# Patient Record
Sex: Female | Born: 1985
Health system: Southern US, Community
[De-identification: ages and names within clinical notes are randomized; demographics above are authoritative.]

## PROBLEM LIST (undated history)

## (undated) ENCOUNTER — Inpatient Hospital Stay (HOSPITAL_COMMUNITY): Payer: Self-pay

## (undated) DIAGNOSIS — N393 Stress incontinence (female) (male): Secondary | ICD-10-CM

## (undated) DIAGNOSIS — R011 Cardiac murmur, unspecified: Secondary | ICD-10-CM

## (undated) DIAGNOSIS — Z8709 Personal history of other diseases of the respiratory system: Secondary | ICD-10-CM

## (undated) DIAGNOSIS — Z8679 Personal history of other diseases of the circulatory system: Secondary | ICD-10-CM

## (undated) HISTORY — DX: Cardiac murmur, unspecified: R01.1

## (undated) HISTORY — PX: MOLE REMOVAL: SHX2046

## (undated) HISTORY — PX: WISDOM TOOTH EXTRACTION: SHX21

---

## 2011-04-14 ENCOUNTER — Emergency Department: Payer: Self-pay | Admitting: Emergency Medicine

## 2011-11-28 ENCOUNTER — Encounter: Payer: Self-pay | Admitting: Obstetrics & Gynecology

## 2011-11-28 ENCOUNTER — Ambulatory Visit (INDEPENDENT_AMBULATORY_CARE_PROVIDER_SITE_OTHER): Payer: Private Health Insurance - Indemnity | Admitting: Obstetrics & Gynecology

## 2011-11-28 VITALS — BP 76/55 | HR 84 | Ht 65.0 in | Wt 151.0 lb

## 2011-11-28 DIAGNOSIS — Z319 Encounter for procreative management, unspecified: Secondary | ICD-10-CM

## 2011-11-28 NOTE — Progress Notes (Signed)
  Subjective:    Patient ID: [name], female    DOB: June 15, 1986, 26 y.o.   MRN: [id number]  HPI  Mrs. [name] is a 26 yo MW G0 who has been with her husband for 8 years, married 4 years, and having unprotected IC for about 6 or 7 years. She has q 28 day cycles and has + ovulation prediction kits. Her husband is young and healthy and had a "normal" semen analysis 3/13. He has never fathered any children. They both deny a history of STIs. She does report dysmenorrhea.  Review of Systems They have received all of their gyn care at [facility] to date She takes MVIs daily    Objective:   Physical Exam        Assessment & Plan:  Primary infertility- I have referred her to Dr. [name] for further evaluation.

## 2012-03-18 ENCOUNTER — Inpatient Hospital Stay (HOSPITAL_COMMUNITY): Payer: Managed Care, Other (non HMO)

## 2012-03-18 ENCOUNTER — Inpatient Hospital Stay (HOSPITAL_COMMUNITY)
Admission: AD | Admit: 2012-03-18 | Discharge: 2012-03-18 | Disposition: A | Discharge status: Home / Self Care | Payer: Managed Care, Other (non HMO) | Source: Ambulatory Visit | Attending: Obstetrics & Gynecology | Admitting: Obstetrics & Gynecology

## 2012-03-18 ENCOUNTER — Encounter (HOSPITAL_COMMUNITY): Payer: Self-pay

## 2012-03-18 DIAGNOSIS — R109 Unspecified abdominal pain: Secondary | ICD-10-CM | POA: Insufficient documentation

## 2012-03-18 DIAGNOSIS — O039 Complete or unspecified spontaneous abortion without complication: Secondary | ICD-10-CM | POA: Insufficient documentation

## 2012-03-18 LAB — CBC
HCT: 40.8 % (ref 36.0–46.0)
Platelets: 244 10*3/uL (ref 150–400)
RBC: 4.48 MIL/uL (ref 3.87–5.11)
RDW: 12.5 % (ref 11.5–15.5)
WBC: 7.9 10*3/uL (ref 4.0–10.5)

## 2012-03-18 LAB — ABO/RH: ABO/RH(D): O POS

## 2012-03-18 MED ORDER — PROMETHAZINE HCL 25 MG PO TABS
25.0000 mg | ORAL_TABLET | Freq: Four times a day (QID) | ORAL | Status: DC | PRN
Start: 2012-03-18 — End: 2012-07-16

## 2012-03-18 MED ORDER — PROMETHAZINE HCL 25 MG/ML IJ SOLN
25.0000 mg | Freq: Once | INTRAMUSCULAR | Status: AC
  Administered 2012-03-18: 25 mg via INTRAMUSCULAR
  Filled 2012-03-18: qty 1

## 2012-03-18 MED ORDER — IBUPROFEN 800 MG PO TABS: 800.0000 mg | ORAL_TABLET | Freq: Three times a day (TID) | ORAL | Status: AC

## 2012-03-18 MED ORDER — HYDROMORPHONE HCL PF 1 MG/ML IJ SOLN
2.0000 mg | Freq: Once | INTRAMUSCULAR | Status: AC
  Administered 2012-03-18: 2 mg via INTRAVENOUS
  Filled 2012-03-18: qty 2

## 2012-03-18 MED ORDER — ONDANSETRON 8 MG PO TBDP: 8.0000 mg | ORAL_TABLET | Freq: Once | ORAL | Status: AC

## 2012-03-18 MED ORDER — KETOROLAC TROMETHAMINE 60 MG/2ML IM SOLN
60.0000 mg | Freq: Once | INTRAMUSCULAR | Status: AC
  Administered 2012-03-18: 60 mg via INTRAMUSCULAR
  Filled 2012-03-18: qty 2

## 2012-03-18 MED ORDER — LACTATED RINGERS IV BOLUS (SEPSIS)
1000.0000 mL | Freq: Once | INTRAVENOUS | Status: AC
  Administered 2012-03-18: 1000 mL via INTRAVENOUS

## 2012-03-18 MED ORDER — IBUPROFEN 800 MG PO TABS: 800.0000 mg | ORAL_TABLET | Freq: Three times a day (TID) | ORAL | Status: DC

## 2012-03-18 MED ORDER — ONDANSETRON 8 MG PO TBDP
8.0000 mg | ORAL_TABLET | Freq: Once | ORAL | Status: AC
  Administered 2012-03-18: 8 mg via ORAL
  Filled 2012-03-18: qty 1

## 2012-03-18 NOTE — MAU Note (Signed)
Patient was taken out to vehicle by wheelchair vomited brought back into room 7 in MAU, Pamelia Hoit NP notified order for Phenergan placed, notified patient and family of plan of care.

## 2012-03-18 NOTE — MAU Note (Signed)
Patient states she has been seeing the [facility] in [place] and conceived on Clomid. States she has some spotting for several days and states they called her yesterday that her BHCG had fallen from 134 to 56.  States she started having intense abdominal pain last night and getting worse, slight spotting.

## 2012-03-18 NOTE — MAU Provider Note (Signed)
  History     CSN: [id number]  Arrival date and time: 03/18/12 1053   First Provider Initiated Contact with Patient 03/18/12 1121      Chief Complaint  Patient presents with  . Abdominal Cramping   HPI  Pt is G1P0 and was seeing [facility] [place]; she conceived on Clomid and has been followed with HCG and ultrasound.  Yesterday the [facility] called her and told her that her HCG had dropped from 134 to 54.  She has been having abdominal pain and spotting for several days.  Her pain has worsened with rectal pressure and she started bleeding when she got to MAU.    Past Medical History  Diagnosis Date  . Heart murmur     Past Surgical History  Procedure Date  . Mole removal     on back  . Wisdom tooth extraction     x 2    Family History  Problem Relation Age of Onset  . Alzheimer's disease Maternal Grandmother   . Osteoporosis Maternal Grandmother   . Hyperlipidemia Maternal Grandmother   . Stroke Paternal Grandfather     History  Substance Use Topics  . Smoking status: Never Smoker   . Smokeless tobacco: Not on file  . Alcohol Use: No    Allergies: No Known Allergies  Prescriptions prior to admission  Medication Sig Dispense Refill  . Misc Natural Product [facility] EYE DROPS #1 OP) Apply 1 drop to eye daily as needed. For dryness/itching      . Multiple Vitamin (MULTIVITAMIN WITH MINERALS) TABS Take 1 tablet by mouth daily.        ROS Physical Exam   Blood pressure 115/75, pulse 99, temperature 98.5 F (36.9 C), temperature source Oral, resp. rate 16, height 5' 4.5" (1.638 m), weight 149 lb 6.4 oz (67.767 kg), last menstrual period 02/06/2012, SpO2 100.00%.  Physical Exam  MAU Course  Procedures  Pt was hyper ventilating and having increase pain- feeling numb and tingling in hands- O2 given and LR bolus along with Toradol 60mg  and Dilaudid 2 mg Pt felt better after medication but then felt nauseated - zofran 8 mg ODT given Pt discharged home and was  leaving and threw up Phenergan 25 mg injection   Assessment and Plan Miscarriage- f/u with Dr. Marice Potter in 1 week at [facility] for repeat HCG Motrin 800 mg q 8 hrs for pain Zofran and phenergan prescription    Jerilyn Gillaspie 03/18/2012, 12:32 PM

## 2012-03-18 NOTE — MAU Note (Signed)
Patient back from ultrasound diaphoretic called Pamelia Hoit NP into room, orders received.

## 2012-03-18 NOTE — MAU Provider Note (Signed)
Attestation of Attending Supervision of Advanced Practitioner (CNM/NP): Evaluation and management procedures were performed by the Advanced Practitioner under my supervision and collaboration.  I have reviewed the Advanced Practitioner's note and chart, and I agree with the management and plan.  Neilani Duffee, M.D. 03/18/2012 3:07 PM  

## 2012-03-18 NOTE — MAU Note (Signed)
O2 at 4L via nasal cannala.Marland Kitchen

## 2012-03-18 NOTE — MAU Note (Signed)
Patient states she is having a lot of rectal pressure.

## 2012-03-25 ENCOUNTER — Other Ambulatory Visit (INDEPENDENT_AMBULATORY_CARE_PROVIDER_SITE_OTHER): Payer: Managed Care, Other (non HMO) | Admitting: *Deleted

## 2012-03-25 DIAGNOSIS — O021 Missed abortion: Secondary | ICD-10-CM

## 2012-03-25 NOTE — Progress Notes (Signed)
Patient is here for hcg quant follow up from the hospital.

## 2012-03-26 LAB — HCG, QUANTITATIVE, PREGNANCY: hCG, Beta Chain, Quant, S: <2 m[IU]/mL

## 2012-05-25 ENCOUNTER — Encounter: Payer: Self-pay | Admitting: Obstetrics and Gynecology

## 2012-07-16 ENCOUNTER — Encounter (HOSPITAL_COMMUNITY): Payer: Self-pay

## 2012-07-17 ENCOUNTER — Ambulatory Visit (HOSPITAL_COMMUNITY): Payer: Managed Care, Other (non HMO)

## 2012-07-17 ENCOUNTER — Ambulatory Visit (HOSPITAL_COMMUNITY)
Admission: AD | Admit: 2012-07-17 | Discharge: 2012-07-17 | Disposition: A | Discharge status: Home / Self Care | Payer: Managed Care, Other (non HMO) | Source: Ambulatory Visit | Attending: Obstetrics and Gynecology | Admitting: Obstetrics and Gynecology

## 2012-07-17 ENCOUNTER — Encounter (HOSPITAL_COMMUNITY)
Admission: AD | Disposition: A | Discharge status: Home / Self Care | Payer: Self-pay | Source: Ambulatory Visit | Attending: Obstetrics and Gynecology

## 2012-07-17 ENCOUNTER — Encounter (HOSPITAL_COMMUNITY): Payer: Self-pay | Admitting: Anesthesiology

## 2012-07-17 ENCOUNTER — Ambulatory Visit (HOSPITAL_COMMUNITY): Payer: Managed Care, Other (non HMO) | Admitting: Anesthesiology

## 2012-07-17 DIAGNOSIS — O021 Missed abortion: Secondary | ICD-10-CM | POA: Insufficient documentation

## 2012-07-17 HISTORY — PX: DILATION AND EVACUATION: SHX1459

## 2012-07-17 SURGERY — DILATION AND EVACUATION, UTERUS
Anesthesia: Monitor Anesthesia Care | Site: Uterus | Wound class: Clean Contaminated

## 2012-07-17 MED ORDER — METOCLOPRAMIDE HCL 5 MG/ML IJ SOLN: 10.0000 mg | Freq: Once | INTRAMUSCULAR | Status: DC | PRN

## 2012-07-17 MED ORDER — DEXAMETHASONE SODIUM PHOSPHATE 4 MG/ML IJ SOLN
INTRAMUSCULAR | Status: DC | PRN
  Administered 2012-07-17: 10 mg via INTRAVENOUS

## 2012-07-17 MED ORDER — LACTATED RINGERS IV SOLN
INTRAVENOUS | Status: DC
  Administered 2012-07-17: 50 mL/h via INTRAVENOUS

## 2012-07-17 MED ORDER — GLYCOPYRROLATE 0.2 MG/ML IJ SOLN
INTRAMUSCULAR | Status: AC
  Filled 2012-07-17: qty 1

## 2012-07-17 MED ORDER — BUPIVACAINE HCL (PF) 0.25 % IJ SOLN
INTRAMUSCULAR | Status: AC
  Filled 2012-07-17: qty 30

## 2012-07-17 MED ORDER — LIDOCAINE HCL (CARDIAC) 20 MG/ML IV SOLN
INTRAVENOUS | Status: AC
  Filled 2012-07-17: qty 5

## 2012-07-17 MED ORDER — PROPOFOL 10 MG/ML IV EMUL
INTRAVENOUS | Status: AC
  Filled 2012-07-17: qty 20

## 2012-07-17 MED ORDER — FENTANYL CITRATE 0.05 MG/ML IJ SOLN
INTRAMUSCULAR | Status: DC | PRN
  Administered 2012-07-17: 50 ug via INTRAVENOUS

## 2012-07-17 MED ORDER — ONDANSETRON HCL 4 MG/2ML IJ SOLN
INTRAMUSCULAR | Status: DC | PRN
  Administered 2012-07-17: 4 mg via INTRAVENOUS

## 2012-07-17 MED ORDER — BUPIVACAINE HCL (PF) 0.25 % IJ SOLN
INTRAMUSCULAR | Status: DC | PRN
  Administered 2012-07-17: 10 mL

## 2012-07-17 MED ORDER — DEXAMETHASONE SODIUM PHOSPHATE 10 MG/ML IJ SOLN
INTRAMUSCULAR | Status: AC
  Filled 2012-07-17: qty 1

## 2012-07-17 MED ORDER — FENTANYL CITRATE 0.05 MG/ML IJ SOLN
INTRAMUSCULAR | Status: AC
  Filled 2012-07-17: qty 2

## 2012-07-17 MED ORDER — FENTANYL CITRATE 0.05 MG/ML IJ SOLN: 25.0000 ug | INTRAMUSCULAR | Status: DC | PRN

## 2012-07-17 MED ORDER — MIDAZOLAM HCL 5 MG/5ML IJ SOLN
INTRAMUSCULAR | Status: DC | PRN
  Administered 2012-07-17: 2 mg via INTRAVENOUS

## 2012-07-17 MED ORDER — CEFAZOLIN SODIUM-DEXTROSE 2-3 GM-% IV SOLR
INTRAVENOUS | Status: DC | PRN
  Administered 2012-07-17: 2 g via INTRAVENOUS

## 2012-07-17 MED ORDER — KETOROLAC TROMETHAMINE 30 MG/ML IJ SOLN
INTRAMUSCULAR | Status: AC
  Filled 2012-07-17: qty 1

## 2012-07-17 MED ORDER — MIDAZOLAM HCL 2 MG/2ML IJ SOLN
INTRAMUSCULAR | Status: AC
  Filled 2012-07-17: qty 2

## 2012-07-17 MED ORDER — PROPOFOL 10 MG/ML IV EMUL
INTRAVENOUS | Status: DC | PRN
  Administered 2012-07-17: 200 mg via INTRAVENOUS

## 2012-07-17 MED ORDER — GLYCOPYRROLATE 0.2 MG/ML IJ SOLN
INTRAMUSCULAR | Status: DC | PRN
  Administered 2012-07-17: 0.2 mg via INTRAVENOUS

## 2012-07-17 MED ORDER — MEPERIDINE HCL 25 MG/ML IJ SOLN: 6.2500 mg | INTRAMUSCULAR | Status: DC | PRN

## 2012-07-17 MED ORDER — LIDOCAINE HCL (CARDIAC) 20 MG/ML IV SOLN
INTRAVENOUS | Status: DC | PRN
  Administered 2012-07-17: 50 mg via INTRAVENOUS

## 2012-07-17 MED ORDER — CEFAZOLIN SODIUM-DEXTROSE 2-3 GM-% IV SOLR
INTRAVENOUS | Status: AC
  Filled 2012-07-17: qty 50

## 2012-07-17 MED ORDER — ONDANSETRON HCL 4 MG/2ML IJ SOLN
INTRAMUSCULAR | Status: AC
  Filled 2012-07-17: qty 2

## 2012-07-17 MED ORDER — KETOROLAC TROMETHAMINE 30 MG/ML IJ SOLN
INTRAMUSCULAR | Status: DC | PRN
  Administered 2012-07-17: 30 mg via INTRAVENOUS

## 2012-07-17 SURGICAL SUPPLY — 15 items
CATH ROBINSON RED A/P 16FR (CATHETERS) ×2 IMPLANT
CLOTH BEACON ORANGE TIMEOUT ST (SAFETY) ×2 IMPLANT
GLOVE BIO SURGEON STRL SZ8 (GLOVE) ×2 IMPLANT
GLOVE BIO SURGEON STRL SZ8.5 (GLOVE) ×2 IMPLANT
GOWN STRL REIN XL XLG (GOWN DISPOSABLE) ×4 IMPLANT
KIT BERKELEY 1ST TRIMESTER 3/8 (MISCELLANEOUS) ×2 IMPLANT
NEEDLE SPNL 22GX3.5 QUINCKE BK (NEEDLE) ×2 IMPLANT
NS IRRIG 1000ML POUR BTL (IV SOLUTION) ×2 IMPLANT
PACK VAGINAL MINOR WOMEN LF (CUSTOM PROCEDURE TRAY) ×2 IMPLANT
PAD OB MATERNITY 4.3X12.25 (PERSONAL CARE ITEMS) ×2 IMPLANT
PAD PREP 24X48 CUFFED NSTRL (MISCELLANEOUS) ×2 IMPLANT
SET BERKELEY SUCTION TUBING (SUCTIONS) ×2 IMPLANT
SYR CONTROL 10ML LL (SYRINGE) ×2 IMPLANT
TOWEL OR 17X24 6PK STRL BLUE (TOWEL DISPOSABLE) ×2 IMPLANT
VACURETTE 8 RIGID CVD (CANNULA) ×2 IMPLANT

## 2012-07-17 NOTE — Anesthesia Postprocedure Evaluation (Signed)
Anesthesia Post Note  Patient: [name]  Procedure(s) Performed: Procedure(s) (LRB): DILATATION AND EVACUATION (N/A)  Anesthesia type: General  Patient location: PACU  Post pain: Pain level controlled  Post assessment: Post-op Vital signs reviewed  Last Vitals:  Filed Vitals:   07/17/12 1417  BP:   Pulse: 88  Temp: 36.5 C  Resp: 18    Post vital signs: Reviewed  Level of consciousness: sedated  Complications: No apparent anesthesia complicationsfj

## 2012-07-17 NOTE — Transfer of Care (Signed)
Immediate Anesthesia Transfer of Care Note  Patient: [name]  Procedure(s) Performed: Procedure(s) (LRB) with comments: DILATATION AND EVACUATION (N/A) - chromosome studies  Patient Location: PACU  Anesthesia Type:General  Level of Consciousness: awake, alert  and oriented  Airway & Oxygen Therapy: Patient Spontanous Breathing and Patient connected to nasal cannula oxygen  Post-op Assessment: Report given to PACU RN and Post -op Vital signs reviewed and stable  Post vital signs: Reviewed and stable  Complications: No apparent anesthesia complications

## 2012-07-17 NOTE — Progress Notes (Signed)
I spoke with pt before her procedure.  She was nervous, particularly about the surgery itself.  She reported that she has good support to help her with her grief.  She is aware that there are resources for support in the community as well.    [id number] [place] Pager, [phone] 12:09 PM   07/17/12 1200  Clinical Encounter Type  Visited With Patient  Visit Type Initial;Spiritual support

## 2012-07-17 NOTE — Anesthesia Preprocedure Evaluation (Signed)
Anesthesia Evaluation  Patient identified by MRN, date of birth, ID band Patient awake    Reviewed: Allergy & Precautions, H&P , NPO status , Patient's Chart, lab work & pertinent test results  Airway Mallampati: II TM Distance: >3 FB Neck ROM: full    Dental No notable dental hx. (+) Teeth Intact   Pulmonary neg pulmonary ROS,    Pulmonary exam normal       Cardiovascular + Valvular Problems/Murmurs Rhythm:regular Rate:Normal     Neuro/Psych negative neurological ROS  negative psych ROS   GI/Hepatic negative GI ROS, Neg liver ROS,   Endo/Other  negative endocrine ROS  Renal/GU negative Renal ROS  negative genitourinary   Musculoskeletal   Abdominal Normal abdominal exam  (+)   Peds  Hematology   Anesthesia Other Findings   Reproductive/Obstetrics (+) Pregnancy                           Anesthesia Physical Anesthesia Plan  ASA: II  Anesthesia Plan: MAC and General LMA   Post-op Pain Management:    Induction:   Airway Management Planned:   Additional Equipment:   Intra-op Plan:   Post-operative Plan:   Informed Consent: I have reviewed the patients History and Physical, chart, labs and discussed the procedure including the risks, benefits and alternatives for the proposed anesthesia with the patient or authorized representative who has indicated his/her understanding and acceptance.   Dental Advisory Given  Plan Discussed with: Anesthesiologist, CRNA and Surgeon  Anesthesia Plan Comments:         Anesthesia Quick Evaluation

## 2012-07-17 NOTE — H&P (Signed)
  [name] is a 26 y.o. female with missed ab at 8 wk, previous SAB's, for D&C and chr analysis on POC.  Medical Hx: negative Allergies: none Medications: none  Review of Systems  Constitutional: Negative.   HENT: Negative.   Eyes: Negative.   Respiratory: Negative.   Cardiovascular: Negative.   Gastrointestinal: Negative.   Genitourinary: Negative.   Musculoskeletal: Negative.   Skin: Negative.   Neurological: Negative.   Endo/Heme/Allergies: Negative.   Psychiatric/Behavioral: Negative.    See VS's Physical Exam  Constitutional: She is oriented to person, place, and time. She appears well-developed and well-nourished.  HENT:  Head: Normocephalic and atraumatic.  Nose: Nose normal.  Mouth/Throat: Oropharynx is clear and moist. No oropharyngeal exudate.  Eyes: Conjunctivae normal and EOM are normal. Pupils are equal, round, and reactive to light. No scleral icterus.  Neck: Normal range of motion. Neck supple. No tracheal deviation present. No thyromegaly present.  Cardiovascular: Normal rate.   Respiratory: Effort normal and breath sounds normal.  GI: Soft. Bowel sounds are normal. She exhibits no distension and no mass. There is no tenderness.  Lymphadenopathy:    She has no cervical adenopathy.  Neurological: She is alert and oriented to person, place, and time. She has normal reflexes.  Skin: Skin is warm.  Psychiatric: She has a normal mood and affect. Her behavior is normal. Judgment and thought content normal.   Imp: Missed Ab at 8 wk RPL  Plan: Suction D&C, chromosome analysis Benefits and risks discussed.  [name]

## 2012-07-20 ENCOUNTER — Encounter (HOSPITAL_COMMUNITY): Payer: Self-pay | Admitting: Obstetrics and Gynecology

## 2012-07-22 ENCOUNTER — Encounter: Payer: Managed Care, Other (non HMO) | Admitting: Obstetrics and Gynecology

## 2012-08-07 NOTE — Op Note (Signed)
OPERATIVE NOTE  Preoperative diagnosis: Missed Abortion at 8 wk, RPL  Postoperative diagnosis: The same  Procedure:Suction D&C  Surgeon: [name]  Anesthesia: General  Complications: None  Estimated blood loss: Less than 20 mL  Specimen: Uterine curettings to pathology  Description of procedure: Patient was placed in dorsal supine position. General anesthesia was administered. She was placed in lithotomy position. She was prepped and draped in sterile manner. Cervix was dilated to 84F with Shawnie Pons dilators. Tissue was obtained under TAUS guidance, confirmed to be chorionic and sent for chromosome analysis. Rest of the uterine contents were aspirated with suction curettage. Using a sharp curet endometrium was curetted and the entire specimen was sent to pathology.  Hemostasis was insured. Instrument count was correct. Estimated blood loss was less than 20 mL. The patient tolerated the procedure well and was transferred to recovery in satisfactory condition.

## 2013-02-19 ENCOUNTER — Encounter: Payer: Self-pay | Admitting: Obstetrics & Gynecology

## 2013-02-19 ENCOUNTER — Other Ambulatory Visit: Payer: Self-pay | Admitting: Obstetrics & Gynecology

## 2013-02-19 ENCOUNTER — Ambulatory Visit (INDEPENDENT_AMBULATORY_CARE_PROVIDER_SITE_OTHER): Payer: Managed Care, Other (non HMO) | Admitting: Obstetrics & Gynecology

## 2013-02-19 VITALS — BP 132/74 | Wt 146.0 lb

## 2013-02-19 DIAGNOSIS — Z3491 Encounter for supervision of normal pregnancy, unspecified, first trimester: Secondary | ICD-10-CM

## 2013-02-19 DIAGNOSIS — Z1151 Encounter for screening for human papillomavirus (HPV): Secondary | ICD-10-CM

## 2013-02-19 DIAGNOSIS — Z124 Encounter for screening for malignant neoplasm of cervix: Secondary | ICD-10-CM

## 2013-02-19 DIAGNOSIS — Z3400 Encounter for supervision of normal first pregnancy, unspecified trimester: Secondary | ICD-10-CM | POA: Insufficient documentation

## 2013-02-19 DIAGNOSIS — Z3480 Encounter for supervision of other normal pregnancy, unspecified trimester: Secondary | ICD-10-CM

## 2013-02-19 DIAGNOSIS — Z3682 Encounter for antenatal screening for nuchal translucency: Secondary | ICD-10-CM

## 2013-02-19 NOTE — Progress Notes (Signed)
P = 76 

## 2013-02-19 NOTE — Progress Notes (Deleted)
Subjective:    [name] is a [id number] [redacted]w[redacted]d being seen today for her first obstetrical visit.  Her obstetrical history is significant for recurrent miscarriages. Patient does intend to breast feed. Pregnancy history fully reviewed.  Patient reports backache and nausea.  Filed Vitals:   02/19/13 1059  BP: 132/74  Weight: 146 lb (66.225 kg)    HISTORY: OB History   Grav Para Term Preterm Abortions TAB SAB Ect Mult Living   3 0 0 0 2 0 2 0 0 0      # Outc Date GA Lbr Len/2nd Wgt Sex Del Anes PTL Lv   1 SAB            2 SAB            3 CUR              Past Medical History  Diagnosis Date  . Heart murmur   . Supervision of normal first pregnancy 02/19/2013    [facility] patient  Genetics: Anatomy: Glucola: GBS: Contraception: Feeding:    Past Surgical History  Procedure Laterality Date  . Mole removal      on back  . Wisdom tooth extraction      x 2  . Dilation and evacuation  07/17/2012    Procedure: DILATATION AND EVACUATION;  Surgeon: [name], MD;  Location: [facility];  Service: Gynecology;  Laterality: N/A;  chromosome studies   Family History  Problem Relation Age of Onset  . Alzheimer's disease Maternal Grandmother   . Osteoporosis Maternal Grandmother   . Hyperlipidemia Maternal Grandmother   . Stroke Paternal Grandfather   . Brain cancer Maternal Grandfather      Exam    Uterus:     Pelvic Exam:    Perineum: No Hemorrhoids   Vulva: normal   Vagina:  normal mucosa   pH:    Cervix: anteverted   Adnexa: normal adnexa   Bony Pelvis: android  System: Breast:  normal appearance, no masses or tenderness   Skin: normal coloration and turgor, no rashes    Neurologic: oriented   Extremities: normal strength, tone, and muscle mass   HEENT PERRLA   Mouth/Teeth mucous membranes moist, pharynx normal without lesions   Neck supple   Cardiovascular: regular rate and rhythm   Respiratory:  appears well, vitals normal, no respiratory  distress, acyanotic, normal RR, ear and throat exam is normal, neck free of mass or lymphadenopathy, chest clear, no wheezing, crepitations, rhonchi, normal symmetric air entry   Abdomen: soft, non-tender; bowel sounds normal; no masses,  no organomegaly   Urinary: urethral meatus normal      Assessment:    Pregnancy: [id number] Patient Active Problem List   Diagnosis Date Noted  . Supervision of normal first pregnancy 02/19/2013        Plan:     Initial labs drawn. Prenatal vitamins. Problem list reviewed and updated. Genetic Screening discussed {GENETIC SCREENING TEST:22046}: {requests/ordered/declines:14581}.  Ultrasound discussed; fetal survey: {requests/ordered/declines:14581}.  Follow up in {numbers 0-4:31231} weeks. ***% of *** min visit spent on counseling and coordination of care.  ***   [name] C. 02/19/2013    Subjective:    [name] is a [id number] [redacted]w[redacted]d being seen today for her first obstetrical visit.  Her obstetrical history is significant for {ob risk factors:10154}. Patient {does/does not:19097} intend to breast feed. Pregnancy history fully reviewed.  Patient reports {sx:14538}.  Filed Vitals:   02/19/13 1059  BP: 132/74  Weight:  146 lb (66.225 kg)    HISTORY: OB History   Grav Para Term Preterm Abortions TAB SAB Ect Mult Living   3 0 0 0 2 0 2 0 0 0      # Outc Date GA Lbr Len/2nd Wgt Sex Del Anes PTL Lv   1 SAB            2 SAB            3 CUR              Past Medical History  Diagnosis Date  . Heart murmur   . Supervision of normal first pregnancy 02/19/2013    [facility] patient  Genetics: Anatomy: Glucola: GBS: Contraception: Feeding:    Past Surgical History  Procedure Laterality Date  . Mole removal      on back  . Wisdom tooth extraction      x 2  . Dilation and evacuation  07/17/2012    Procedure: DILATATION AND EVACUATION;  Surgeon: [name], MD;  Location: [facility];  Service: Gynecology;  Laterality:  N/A;  chromosome studies   Family History  Problem Relation Age of Onset  . Alzheimer's disease Maternal Grandmother   . Osteoporosis Maternal Grandmother   . Hyperlipidemia Maternal Grandmother   . Stroke Paternal Grandfather   . Brain cancer Maternal Grandfather      Exam    Uterus:     Pelvic Exam:    Perineum: {Exam; anus and perineum:14598}   Vulva: {vulva exam:14487}   Vagina:  {vaginal exam:14498}   pH: ***   Cervix: {cervix exam:14595}   Adnexa: {adnexa:12223}   Bony Pelvis: {desc; pelvis:14491}  System: Breast:  {Exam; breast:13139::"normal appearance, no masses or tenderness"}   Skin: {pe skin brief ob:314459::"normal coloration and turgor, no rashes"}    Neurologic: {Exam; neuro:16375}   Extremities: {Exam; extremities:15096}   HEENT {exam; heent:31974}   Mouth/Teeth {pe mouth simple ob:314450::"mucous membranes moist, pharynx normal without lesions"}   Neck {Neck (ob):32092}   Cardiovascular: {HEART EXAM HEM/ONC:21750}   Respiratory:  {Exam; respiratory:5782::"appears well, vitals normal, no respiratory distress, acyanotic, normal RR","ear and throat exam is normal","neck free of mass or lymphadenopathy","chest clear, no wheezing, crepitations, rhonchi, normal symmetric air entry"}   Abdomen: {Exam; abdomen:16834}   Urinary: {exam; urinary female:30845}      Assessment:    Pregnancy: [id number] Patient Active Problem List   Diagnosis Date Noted  . Supervision of normal first pregnancy 02/19/2013        Plan:     Initial labs drawn. Prenatal vitamins. Problem list reviewed and updated. Genetic Screening discussed {GENETIC SCREENING TEST:22046}: {requests/ordered/declines:14581}.  Ultrasound discussed; fetal survey: {requests/ordered/declines:14581}.  Follow up in {numbers 0-4:31231} weeks. ***% of *** min visit spent on counseling and coordination of care.  ***   [name] C. 02/19/2013

## 2013-02-20 LAB — OBSTETRIC PANEL
Antibody Screen: NEGATIVE
Eosinophils Relative: 1 % (ref 0–5)
HCT: 40.3 % (ref 36.0–46.0)
Lymphocytes Relative: 10 % — ABNORMAL LOW (ref 12–46)
Lymphs Abs: 0.8 10*3/uL (ref 0.7–4.0)
MCH: 30.6 pg (ref 26.0–34.0)
MCV: 90.8 fL (ref 78.0–100.0)
Monocytes Absolute: 0.5 10*3/uL (ref 0.1–1.0)
RBC: 4.44 MIL/uL (ref 3.87–5.11)
Rh Type: POSITIVE
Rubella: 2.08 Index — ABNORMAL HIGH (ref <0.90)
WBC: 8 10*3/uL (ref 4.0–10.5)

## 2013-02-20 LAB — HIV ANTIBODY (ROUTINE TESTING W REFLEX): HIV: NONREACTIVE

## 2013-03-04 NOTE — Progress Notes (Signed)
   Subjective:    [name] is a [id number] [redacted]w[redacted]d being seen today for her first obstetrical visit.  Her obstetrical history is significant for none. Patient does intend to breast feed. Pregnancy history fully reviewed.  Patient reports no complaints.  Filed Vitals:   02/19/13 1059  BP: 132/74  Weight: 66.225 kg (146 lb)    HISTORY: OB History   Grav Para Term Preterm Abortions TAB SAB Ect Mult Living   3 0 0 0 2 0 2 0 0 0      # Outc Date GA Lbr Len/2nd Wgt Sex Del Anes PTL Lv   1 SAB            2 SAB            3 CUR              Past Medical History  Diagnosis Date  . Heart murmur   . Supervision of normal first pregnancy 02/19/2013    [facility] patient  Genetics: Anatomy: Glucola: GBS: Contraception: Feeding:    Past Surgical History  Procedure Laterality Date  . Mole removal      on back  . Wisdom tooth extraction      x 2  . Dilation and evacuation  07/17/2012    Procedure: DILATATION AND EVACUATION;  Surgeon: [name], MD;  Location: [facility];  Service: Gynecology;  Laterality: N/A;  chromosome studies   Family History  Problem Relation Age of Onset  . Alzheimer's disease Maternal Grandmother   . Osteoporosis Maternal Grandmother   . Hyperlipidemia Maternal Grandmother   . Stroke Paternal Grandfather   . Brain cancer Maternal Grandfather      Exam    Uterus:     Pelvic Exam:    Perineum: No Hemorrhoids   Vulva: normal   Vagina:  normal mucosa   pH:    Cervix: anteverted   Adnexa: normal adnexa   Bony Pelvis: android  System: Breast:  normal appearance, no masses or tenderness   Skin: normal coloration and turgor, no rashes    Neurologic: oriented   Extremities: normal strength, tone, and muscle mass   HEENT PERRLA   Mouth/Teeth mucous membranes moist, pharynx normal without lesions   Neck supple   Cardiovascular: regular rate and rhythm   Respiratory:  appears well, vitals normal, no respiratory distress, acyanotic,  normal RR, ear and throat exam is normal, neck free of mass or lymphadenopathy, chest clear, no wheezing, crepitations, rhonchi, normal symmetric air entry   Abdomen: soft, non-tender; bowel sounds normal; no masses,  no organomegaly   Urinary: urethral meatus normal      Assessment:    Pregnancy: [id number] Patient Active Problem List   Diagnosis Date Noted  . Supervision of normal first pregnancy 02/19/2013        Plan:     Initial labs drawn. Prenatal vitamins. Problem list reviewed and updated. Genetic Screening discussed Quad Screen: undecided.  Ultrasound discussed; fetal survey: requested.  Follow up in 4 weeks.    Cynithia Hakimi C. 03/04/2013

## 2013-03-10 ENCOUNTER — Ambulatory Visit (INDEPENDENT_AMBULATORY_CARE_PROVIDER_SITE_OTHER): Payer: Managed Care, Other (non HMO) | Admitting: Obstetrics & Gynecology

## 2013-03-10 ENCOUNTER — Encounter: Payer: Self-pay | Admitting: Obstetrics & Gynecology

## 2013-03-10 VITALS — BP 118/81 | Wt 147.0 lb

## 2013-03-10 DIAGNOSIS — Z3400 Encounter for supervision of normal first pregnancy, unspecified trimester: Secondary | ICD-10-CM

## 2013-03-10 NOTE — Progress Notes (Signed)
P - 76 - Doppler picked up FHT of 161 but was unable to actually hear it.

## 2013-03-10 NOTE — Progress Notes (Signed)
Routine visit. No problems. She has her NT scan with MFM on 03/19/13.

## 2013-03-19 ENCOUNTER — Ambulatory Visit (HOSPITAL_COMMUNITY)
Admission: RE | Admit: 2013-03-19 | Discharge: 2013-03-19 | Disposition: A | Discharge status: Home / Self Care | Payer: Managed Care, Other (non HMO) | Source: Ambulatory Visit | Attending: Obstetrics & Gynecology | Admitting: Obstetrics & Gynecology

## 2013-03-19 ENCOUNTER — Other Ambulatory Visit: Payer: Self-pay

## 2013-03-19 DIAGNOSIS — O3510X0 Maternal care for (suspected) chromosomal abnormality in fetus, unspecified, not applicable or unspecified: Secondary | ICD-10-CM | POA: Insufficient documentation

## 2013-03-19 DIAGNOSIS — O351XX0 Maternal care for (suspected) chromosomal abnormality in fetus, not applicable or unspecified: Secondary | ICD-10-CM | POA: Insufficient documentation

## 2013-03-19 DIAGNOSIS — Z3689 Encounter for other specified antenatal screening: Secondary | ICD-10-CM | POA: Insufficient documentation

## 2013-03-19 DIAGNOSIS — Z3682 Encounter for antenatal screening for nuchal translucency: Secondary | ICD-10-CM

## 2013-03-19 NOTE — Progress Notes (Signed)
[name]  was seen today for an ultrasound appointment.  See full report in [facility].  Impression: Single IUP at 12 4/7 weeks Normal NT (1.7 mm), Nasal bone visualized First trimester aneuploidy screen performed as noted above.   Recommendations: Please do not draw triple/quad screen, though patient should be offered MSAFP for neural tube defect screening.  Recommend ultrasound for fetal anatomy at [redacted] weeks gestation.  Alpha Gula, MD

## 2013-03-22 ENCOUNTER — Encounter: Payer: Self-pay | Admitting: Obstetrics & Gynecology

## 2013-04-07 ENCOUNTER — Encounter: Payer: Self-pay | Admitting: Obstetrics and Gynecology

## 2013-04-07 ENCOUNTER — Ambulatory Visit (INDEPENDENT_AMBULATORY_CARE_PROVIDER_SITE_OTHER): Payer: Managed Care, Other (non HMO) | Admitting: Obstetrics and Gynecology

## 2013-04-07 VITALS — BP 123/82 | Wt 149.0 lb

## 2013-04-07 DIAGNOSIS — Z3402 Encounter for supervision of normal first pregnancy, second trimester: Secondary | ICD-10-CM

## 2013-04-07 DIAGNOSIS — Z3400 Encounter for supervision of normal first pregnancy, unspecified trimester: Secondary | ICD-10-CM

## 2013-04-07 NOTE — Progress Notes (Signed)
Patient doing well, without complaints. NT results reviewed. Anatomy ultrasound scheduled for 18-19 weeks

## 2013-04-07 NOTE — Progress Notes (Signed)
P= 80 

## 2013-04-21 ENCOUNTER — Ambulatory Visit (HOSPITAL_COMMUNITY)
Admission: RE | Admit: 2013-04-21 | Discharge: 2013-04-21 | Disposition: A | Discharge status: Home / Self Care | Payer: Managed Care, Other (non HMO) | Source: Ambulatory Visit | Attending: Obstetrics and Gynecology | Admitting: Obstetrics and Gynecology

## 2013-04-21 DIAGNOSIS — Z3689 Encounter for other specified antenatal screening: Secondary | ICD-10-CM | POA: Insufficient documentation

## 2013-04-21 DIAGNOSIS — Z3402 Encounter for supervision of normal first pregnancy, second trimester: Secondary | ICD-10-CM

## 2013-04-24 ENCOUNTER — Encounter: Payer: Self-pay | Admitting: Obstetrics and Gynecology

## 2013-04-28 ENCOUNTER — Ambulatory Visit (INDEPENDENT_AMBULATORY_CARE_PROVIDER_SITE_OTHER): Payer: Managed Care, Other (non HMO) | Admitting: Obstetrics & Gynecology

## 2013-04-28 VITALS — BP 128/72 | Wt 149.0 lb

## 2013-04-28 DIAGNOSIS — Z3400 Encounter for supervision of normal first pregnancy, unspecified trimester: Secondary | ICD-10-CM

## 2013-04-28 DIAGNOSIS — Z3402 Encounter for supervision of normal first pregnancy, second trimester: Secondary | ICD-10-CM

## 2013-04-28 NOTE — Patient Instructions (Signed)
Return to clinic for any obstetric concerns or go to MAU for evaluation  

## 2013-04-28 NOTE — Progress Notes (Signed)
Normal anatomy scan at 17 weeks.  Normal first screen, will check MSAFP today.  No other complaints or concerns.  Routine obstetric precautions reviewed.

## 2013-04-28 NOTE — Progress Notes (Signed)
P=79 

## 2013-04-30 ENCOUNTER — Encounter: Payer: Self-pay | Admitting: Obstetrics & Gynecology

## 2013-04-30 LAB — ALPHA FETOPROTEIN, MATERNAL
AFP: 54.3 IU/mL
Curr Gest Age: 18 wks.days
MoM for AFP: 1.35
Open Spina bifida: NEGATIVE
Osb Risk: 1:4260 {titer}

## 2013-05-19 ENCOUNTER — Encounter: Payer: Self-pay | Admitting: Obstetrics and Gynecology

## 2013-05-19 ENCOUNTER — Ambulatory Visit (INDEPENDENT_AMBULATORY_CARE_PROVIDER_SITE_OTHER): Payer: Managed Care, Other (non HMO) | Admitting: Obstetrics and Gynecology

## 2013-05-19 VITALS — BP 119/77 | Wt 153.0 lb

## 2013-05-19 DIAGNOSIS — Z3400 Encounter for supervision of normal first pregnancy, unspecified trimester: Secondary | ICD-10-CM

## 2013-05-19 DIAGNOSIS — Z3402 Encounter for supervision of normal first pregnancy, second trimester: Secondary | ICD-10-CM

## 2013-05-19 NOTE — Progress Notes (Signed)
P=93 

## 2013-05-19 NOTE — Progress Notes (Signed)
Patient doing well without complaints. Planning circumcision. Does not desire contraception postpartum

## 2013-05-21 ENCOUNTER — Encounter (HOSPITAL_COMMUNITY): Payer: Self-pay | Admitting: *Deleted

## 2013-05-21 ENCOUNTER — Inpatient Hospital Stay (HOSPITAL_COMMUNITY)
Admission: AD | Admit: 2013-05-21 | Discharge: 2013-05-21 | Disposition: A | Discharge status: Home / Self Care | Payer: Managed Care, Other (non HMO) | Source: Ambulatory Visit | Attending: Obstetrics & Gynecology | Admitting: Obstetrics & Gynecology

## 2013-05-21 DIAGNOSIS — Z3402 Encounter for supervision of normal first pregnancy, second trimester: Secondary | ICD-10-CM

## 2013-05-21 DIAGNOSIS — O99891 Other specified diseases and conditions complicating pregnancy: Secondary | ICD-10-CM | POA: Insufficient documentation

## 2013-05-21 DIAGNOSIS — R109 Unspecified abdominal pain: Secondary | ICD-10-CM | POA: Insufficient documentation

## 2013-05-21 LAB — URINE MICROSCOPIC-ADD ON

## 2013-05-21 LAB — URINALYSIS, ROUTINE W REFLEX MICROSCOPIC
Bilirubin Urine: NEGATIVE
Hgb urine dipstick: NEGATIVE
Ketones, ur: NEGATIVE mg/dL
Nitrite: NEGATIVE
Protein, ur: NEGATIVE mg/dL
Specific Gravity, Urine: 1.015 (ref 1.005–1.030)
Urobilinogen, UA: 0.2 mg/dL (ref 0.0–1.0)

## 2013-05-21 MED ORDER — ACETAMINOPHEN 500 MG PO TABS: 500.0000 mg | ORAL_TABLET | Freq: Four times a day (QID) | ORAL | Status: DC | PRN

## 2013-05-21 NOTE — MAU Provider Note (Addendum)
History     CSN: [id number]  Arrival date and time: 05/21/13 1803   First Provider Initiated Contact with Patient 05/21/13 1855      Chief Complaint  Patient presents with  . Groin Pain   HPI Comments: [name] is a 27 y.o. G3P0020 at [redacted]w[redacted]d who presents with crampy pain in bilateral inguinal area. Symptoms started last night but were gone when she woke up. Symptoms restarted around 1430. She has no alleviating factors and no aggravating factors. She is currently feeling the pain. She has had no contractions, no vaginal discharge or bleeding. Good fetal movement. No dysuria, no fever, no back pain no flank pain. No hematuria. PNC at [place]. Next visit October 22.   OB History   Grav Para Term Preterm Abortions TAB SAB Ect Mult Living   3 0 0 0 2 0 2 0 0 0       Past Medical History  Diagnosis Date  . Heart murmur   . Supervision of normal first pregnancy 02/19/2013    [facility] patient  Genetics: Anatomy: Glucola: GBS: Contraception: Feeding:     Past Surgical History  Procedure Laterality Date  . Mole removal      on back  . Wisdom tooth extraction      x 2  . Dilation and evacuation  07/17/2012    Procedure: DILATATION AND EVACUATION;  Surgeon: [name], MD;  Location: [facility];  Service: Gynecology;  Laterality: N/A;  chromosome studies    Family History  Problem Relation Age of Onset  . Alzheimer's disease Maternal Grandmother   . Osteoporosis Maternal Grandmother   . Hyperlipidemia Maternal Grandmother   . Stroke Paternal Grandfather   . Brain cancer Maternal Grandfather     History  Substance Use Topics  . Smoking status: Never Smoker   . Smokeless tobacco: Never Used  . Alcohol Use: No    Allergies: No Known Allergies  Prescriptions prior to admission  Medication Sig Dispense Refill  . folic acid (FOLVITE) 400 MCG tablet Take 400 mcg by mouth daily.      . Multiple Vitamin (MULTIVITAMIN WITH MINERALS) TABS Take 3 tablets  by mouth daily. Takes 3 gummies "Alive" daily.  This is the recommended dose.      . beta carotene 78295 UNIT capsule Take 10,000 Units by mouth daily.      . Biotin 5 MG CAPS Take 5 mg by mouth daily.      . Calcium Carbonate Antacid (TUMS ULTRA 1000 PO) Take 1 tablet by mouth as needed. heartburn        Review of Systems  Constitutional: Negative for fever.  Gastrointestinal: Positive for abdominal pain.  Genitourinary: Negative for dysuria.  Neurological: Negative for headaches.   Physical Exam   Blood pressure 121/72, pulse 82, temperature 98.7 F (37.1 C), temperature source Oral, resp. rate 16, height 5\' 4"  (1.626 m), weight 70.126 kg (154 lb 9.6 oz), last menstrual period 12/21/2012, SpO2 100.00%.  Physical Exam  Constitutional: She appears well-developed and well-nourished. No distress.  Cardiovascular: Normal rate, regular rhythm, normal heart sounds and intact distal pulses.   No murmur heard. Respiratory: Effort normal and breath sounds normal. No respiratory distress. She has no wheezes. She has no rales.  GI: Soft. Bowel sounds are normal. There is tenderness in the suprapubic area. There is no rebound and no guarding.  Suprapubic tenderness located slightly to the right of midline.  Musculoskeletal: Normal range of motion. She exhibits no edema  and no tenderness.  Neurological: She is alert.  Skin: Skin is warm and dry. She is not diaphoretic. No erythema.   Doppler: 145bpm  MAU Course  Procedures  MDM - urinalysis shows trace leukocyte esterase.  Assessment and Plan  [name] is a 27 y.o. G3P0020 at [redacted]w[redacted]d who presents with suprapubic abdominal pain  - Counseled on returning if symptoms did not improve or if they became more severe - Tylenol q6hrs for pain - Keep well hydrated with water - Encouraged to keep scheduled prenatal appointment to follow up on urine culture to insure no growth - Discharge home  Jacquelin Hawking 05/21/2013, 6:56 PM    Pain was mild, No hard evidence of UTI, pt declined pelvic exam so she could eat dinner. Low concern for active infection. Pt to f/u with primary OB. Possible abnl Round ligament presentation.   I spoke with and examined patient and agree with resident's note and plan of care.  Tawana Scale, MD OB Fellow 05/21/2013 9:00 PM

## 2013-05-21 NOTE — MAU Note (Signed)
Patient states she has had pain and pressure in the groin area, some yesterday and again today. Denies bleeding, discharge or vomiting.

## 2013-06-03 NOTE — MAU Provider Note (Signed)
Attestation of Attending Supervision of Fellow: Evaluation and management procedures were performed by the Fellow under my supervision and collaboration.  I have reviewed the Fellow's note and chart, and I agree with the management and plan.    

## 2013-06-16 ENCOUNTER — Ambulatory Visit (INDEPENDENT_AMBULATORY_CARE_PROVIDER_SITE_OTHER): Payer: Managed Care, Other (non HMO) | Admitting: Obstetrics & Gynecology

## 2013-06-16 VITALS — BP 135/80 | Wt 160.0 lb

## 2013-06-16 DIAGNOSIS — Z3402 Encounter for supervision of normal first pregnancy, second trimester: Secondary | ICD-10-CM

## 2013-06-16 DIAGNOSIS — Z3400 Encounter for supervision of normal first pregnancy, unspecified trimester: Secondary | ICD-10-CM

## 2013-06-16 NOTE — Patient Instructions (Signed)
Tetanus, Diphtheria (Td) or Tetanus, Diphtheria, Pertussis (Tdap) Vaccine What You Need to Know WHY GET VACCINATED? Tetanus, diphtheria and pertussis can be very serious diseases. TETANUS (Lockjaw) causes painful muscle spasms and stiffness, usually all over the body.  Tetanus can lead to tightening of muscles in the head and neck so the victim cannot open his mouth or swallow, or sometimes even breathe. Tetanus kills about 1 out of 5 people who are infected. DIPHTHERIA can cause a thick membrane to cover the back of the throat.  Diphtheria can lead to breathing problems, paralysis, heart failure, and even death. PERTUSSIS (Whooping Cough) causes severe coughing spells which can lead to difficulty breathing, vomiting, and disturbed sleep.  Pertussis can lead to weight loss, incontinence, rib fractures and passing out from violent coughing. Up to 2 in 100 adolescents and 5 in 100 adults with pertussis are hospitalized or have complications, including pneumonia and death. These 3 diseases are all caused by bacteria. Diphtheria and pertussis are spread from person to person. Tetanus enters the body through cuts, scratches, or wounds. The [place] saw as many as 200,000 cases a year of diphtheria and pertussis before vaccines were available, and hundreds of cases of tetanus. Since then, tetanus and diphtheria cases have dropped by about 99% and pertussis cases by about 92%. Children 6 years of age and younger get DTaP vaccine to protect them from these three diseases. But older children, adolescents, and adults need protection too. VACCINES FOR ADOLESCENTS AND ADULTS: TD AND TDAP Two vaccines are available to protect people 7 years of age and older from these diseases:  Td vaccine has been used for many years. It protects against tetanus and diphtheria.  Tdap vaccine was licensed in 2005. It is the first vaccine for adolescents and adults that protects against pertussis as well as tetanus and  diphtheria. A Td booster dose is recommended every 10 years. Tdap is given only once. WHICH VACCINE, AND WHEN? Ages 7 through 18 years  A dose of Tdap is recommended at age 11 or 12. This dose could be given as early as age 7 for children who missed one or more childhood doses of DTaP.  Children and adolescents who did not get a complete series of DTaP shots by age 7 should complete the series using a combination of Td and Tdap. Age 19 years and Older  All adults should get a booster dose of Td every 10 years. Adults under 65 who have never gotten Tdap should get a dose of Tdap as their next booster dose. Adults 65 and older may get one booster dose of Tdap.  Adults (including women who may become pregnant and adults 65 and older) who expect to have close contact with a baby younger than 12 months of age should get a dose of Tdap to help protect the baby from pertussis.  Healthcare professionals who have direct patient contact in hospitals or clinics should get one dose of Tdap. Protection After a Wound  A person who gets a severe cut or burn might need a dose of Td or Tdap to prevent tetanus infection. Tdap should be used for anyone who has never had a dose previously. Td should be used if Tdap is not available, or for:  Anybody who has already had a dose of Tdap.  Children 7 through 9 years of age who completed the childhood DTaP series.  Adults 65 and older. Pregnant Women  Pregnant women who have never had a dose of Tdap   should get one, after the 20th week of gestation and preferably during the 3rd trimester. If they do not get Tdap during their pregnancy they should get a dose as soon as possible after delivery. Pregnant women who have previously received Tdap and need tetanus or diphtheria vaccine while pregnant should get Td. Tdap and Td may be given at the same time as other vaccines. SOME PEOPLE SHOULD NOT BE VACCINATED OR SHOULD WAIT  Anyone who has had a life-threatening  allergic reaction after a dose of any tetanus, diphtheria, or pertussis containing vaccine should not get Td or Tdap.  Anyone who has a severe allergy to any component of a vaccine should not get that vaccine. Tell your doctor if the person getting the vaccine has any severe allergies.  Anyone who had a coma, or long or multiple seizures within 7 days after a dose of DTP or DTaP should not get Tdap, unless a cause other than the vaccine was found. These people may get Td.  Talk to your doctor if the person getting either vaccine:  Has epilepsy or another nervous system problem.  Had severe swelling or severe pain after a previous dose of DTP, DTaP, DT, Td, or Tdap vaccine.  Has had Guillain Barr Syndrome (GBS). Anyone who has a moderate or severe illness on the day the shot is scheduled should usually wait until they recover before getting Tdap or Td vaccine. A person with a mild illness or low fever can usually be vaccinated. WHAT ARE THE RISKS FROM TDAP AND TD VACCINES? With a vaccine, as with any medicine, there is always a small risk of a life-threatening allergic reaction or other serious problem. Brief fainting spells and related symptoms (such as jerking movements) can happen after any medical procedure, including vaccination. Sitting or lying down for about 15 minutes after a vaccination can help prevent fainting and injuries caused by falls. Tell your doctor if the patient feels dizzy or lightheaded, or has vision changes or ringing in the ears. Getting tetanus, diphtheria, or pertussis would be much more likely to lead to severe problems than getting either Td or Tdap vaccine. Problems reported after Td and Tdap vaccines are listed below. Mild Problems (noticeable, but did not interfere with activities) Tdap  Pain (about 3 in 4 adolescents and 2 in 3 adults).  Redness or swelling (about 1 in 5).  Mild fever of at least 100.4 F (38 C) (up to about 1 in 25 adolescents and 1 in  100 adults).  Headache (about 4 in 10 adolescents and 3 in 10 adults).  Tiredness (about 1 in 3 adolescents and 1 in 4 adults).  Nausea, vomiting, diarrhea, or stomach ache (up to 1 in 4 adolescents and 1 in 10 adults).  Chills, body aches, sore joints, rash, or swollen glands (uncommon). Td  Pain (up to about 8 in 10).  Redness or swelling at the injection site (up to about 1 in 3).  Mild fever (up to about 1 in 15).  Headache or tiredness (uncommon). Moderate Problems (interfered with activities, but did not require medical attention) Tdap  Pain at the injection site (about 1 in 20 adolescents and 1 in 100 adults).  Redness or swelling at the injection site (up to about 1 in 16 adolescents and 1 in 25 adults).  Fever over 102 F (38.9 C) (about 1 in 100 adolescents and 1 in 250 adults).  Headache (1 in 300).  Nausea, vomiting, diarrhea, or stomach ache (up to 3   in 100 adolescents and 1 in 100 adults). Td  Fever over 102 F (38.9 C) (rare). Tdap or Td  Extensive swelling of the arm where the shot was given (up to about 3 in 100). Severe Problems (Unable to perform usual activities; required medical attention) Tdap or Td  Swelling, severe pain, bleeding, and redness in the arm where the shot was given (rare). A severe allergic reaction could occur after any vaccine. They are estimated to occur less than once in a million doses. WHAT IF THERE IS A SEVERE REACTION? What should I look for? Any unusual condition, such as a severe allergic reaction or a high fever. If a severe allergic reaction occurred, it would be within a few minutes to an hour after the shot. Signs of a serious allergic reaction can include difficulty breathing, weakness, hoarseness or wheezing, a fast heartbeat, hives, dizziness, paleness, or swelling of the throat. What should I do?  Call a doctor, or get the person to a doctor right away.  Tell your doctor what happened, the date and time it  happened, and when the vaccination was given.  Ask your provider to report the reaction by filing a Vaccine Adverse Event Reporting System (VAERS) form. Or, you can file this report through the VAERS website at www.vaers.hhs.gov or by calling 1-800-822-7967. VAERS does not provide medical advice. THE NATIONAL VACCINE INJURY COMPENSATION PROGRAM The National Vaccine Injury Compensation Program (VICP) was created in 1986. Persons who believe they may have been injured by a vaccine can learn about the program and about filing a claim by calling 1-800-338-2382 or visiting the VICP website at www.hrsa.gov/vaccinecompensation. HOW CAN I LEARN MORE?  Your doctor can give you the vaccine package insert or suggest other sources of information.  Call your local or state health department.  Contact the Centers for Disease Control and Prevention (CDC):  Call 1-800-232-4636 (1-800-CDC-INFO).  Visit the CDC website at www.cdc.gov/vaccines. CDC Td and Tdap Interim VIS (09/18/10) Document Released: 06/09/2006 Document Revised: 11/04/2011 Document Reviewed: 09/18/2010 [employer] Patient Information 2014 [employer].  

## 2013-06-16 NOTE — Progress Notes (Signed)
Declines flu vaccine.  No complaints or concerns.  Routine obstetric precautions reviewed.  Third trimester labs and Tdap vaccine next visit.

## 2013-06-16 NOTE — Progress Notes (Signed)
P-85 

## 2013-07-07 ENCOUNTER — Encounter: Payer: Self-pay | Admitting: Obstetrics and Gynecology

## 2013-07-07 ENCOUNTER — Ambulatory Visit (INDEPENDENT_AMBULATORY_CARE_PROVIDER_SITE_OTHER): Payer: Managed Care, Other (non HMO) | Admitting: Obstetrics and Gynecology

## 2013-07-07 VITALS — BP 128/79 | Wt 162.0 lb

## 2013-07-07 DIAGNOSIS — Z3403 Encounter for supervision of normal first pregnancy, third trimester: Secondary | ICD-10-CM

## 2013-07-07 DIAGNOSIS — Z23 Encounter for immunization: Secondary | ICD-10-CM

## 2013-07-07 DIAGNOSIS — Z3400 Encounter for supervision of normal first pregnancy, unspecified trimester: Secondary | ICD-10-CM

## 2013-07-07 LAB — CBC
Hemoglobin: 12.2 g/dL (ref 12.0–15.0)
MCV: 95.7 fL (ref 78.0–100.0)
Platelets: 214 10*3/uL (ref 150–400)
RBC: 3.74 MIL/uL — ABNORMAL LOW (ref 3.87–5.11)
WBC: 11.9 10*3/uL — ABNORMAL HIGH (ref 4.0–10.5)

## 2013-07-07 MED ORDER — TETANUS-DIPHTH-ACELL PERTUSSIS 5-2.5-18.5 LF-MCG/0.5 IM SUSP: 0.5000 mL | Freq: Once | INTRAMUSCULAR | Status: DC

## 2013-07-07 NOTE — Progress Notes (Signed)
Patient doing well without complaints. FM/PTL precautions reviewed. Tdap and labs today.

## 2013-07-07 NOTE — Progress Notes (Signed)
P = 87 

## 2013-07-09 ENCOUNTER — Encounter: Payer: Self-pay | Admitting: Obstetrics and Gynecology

## 2013-07-21 ENCOUNTER — Encounter: Payer: Self-pay | Admitting: Obstetrics and Gynecology

## 2013-07-21 ENCOUNTER — Ambulatory Visit (INDEPENDENT_AMBULATORY_CARE_PROVIDER_SITE_OTHER): Payer: Managed Care, Other (non HMO) | Admitting: Obstetrics and Gynecology

## 2013-07-21 VITALS — BP 126/80 | Wt 164.0 lb

## 2013-07-21 DIAGNOSIS — Z3403 Encounter for supervision of normal first pregnancy, third trimester: Secondary | ICD-10-CM

## 2013-07-21 DIAGNOSIS — Z3400 Encounter for supervision of normal first pregnancy, unspecified trimester: Secondary | ICD-10-CM

## 2013-07-21 NOTE — Progress Notes (Signed)
p-92 

## 2013-07-21 NOTE — Progress Notes (Signed)
Patient doing without complaints. FM/PTL precautions reviewed. 1hr GCt results reviewed

## 2013-08-04 ENCOUNTER — Ambulatory Visit (INDEPENDENT_AMBULATORY_CARE_PROVIDER_SITE_OTHER): Payer: Managed Care, Other (non HMO) | Admitting: Obstetrics & Gynecology

## 2013-08-04 VITALS — BP 135/83 | Wt 168.0 lb

## 2013-08-04 DIAGNOSIS — Z3403 Encounter for supervision of normal first pregnancy, third trimester: Secondary | ICD-10-CM

## 2013-08-04 DIAGNOSIS — Z3400 Encounter for supervision of normal first pregnancy, unspecified trimester: Secondary | ICD-10-CM

## 2013-08-04 NOTE — Progress Notes (Signed)
P-96 

## 2013-08-04 NOTE — Patient Instructions (Signed)
Return to clinic for any obstetric concerns or go to MAU for evaluation  

## 2013-08-04 NOTE — Progress Notes (Signed)
No other complaints or concerns.  Fetal movement and labor precautions reviewed.  

## 2013-08-17 ENCOUNTER — Ambulatory Visit (INDEPENDENT_AMBULATORY_CARE_PROVIDER_SITE_OTHER): Payer: Managed Care, Other (non HMO) | Admitting: Obstetrics and Gynecology

## 2013-08-17 ENCOUNTER — Encounter: Payer: Self-pay | Admitting: Obstetrics and Gynecology

## 2013-08-17 VITALS — BP 134/74 | Wt 169.0 lb

## 2013-08-17 DIAGNOSIS — Z3400 Encounter for supervision of normal first pregnancy, unspecified trimester: Secondary | ICD-10-CM

## 2013-08-17 DIAGNOSIS — Z3403 Encounter for supervision of normal first pregnancy, third trimester: Secondary | ICD-10-CM

## 2013-08-17 NOTE — Progress Notes (Signed)
P-104  Patient is having increased discharge but no change in consistency.  Watery in nature.

## 2013-08-17 NOTE — Progress Notes (Signed)
Patient doing well without complaints. FM/PTL precautions reviewed. Cultures next visit 

## 2013-08-26 NOTE — L&D Delivery Note (Signed)
Delivery Note At 10:44 AM a viable female was delivered via Vaginal, Spontaneous Delivery (Presentation: Right Occiput Anterior). Nuchal cord, loop expelled with head, reduced over shoulders; APGAR:3 @ 1 min, 8@ 5 min; weight pending.   Placenta status: Intact, Spontaneous.  Cord: 3 vessels with the following complications: None.  Cord pHa: 7.10  Anesthesia: Epidural  Episiotomy: None Lacerations: Vaginal;Bilat Sulcus;rt Labium minorum full thickness Suture Repair: vicryl rapide Est. Blood Loss (mL): 300  Mom to postpartum.  Baby to Couplet care / Skin to Skin.  POE,DEIRDRE 09/27/2013, 11:14 AM

## 2013-09-01 ENCOUNTER — Ambulatory Visit (INDEPENDENT_AMBULATORY_CARE_PROVIDER_SITE_OTHER): Payer: Managed Care, Other (non HMO) | Admitting: Obstetrics & Gynecology

## 2013-09-01 VITALS — BP 121/79 | Wt 172.0 lb

## 2013-09-01 DIAGNOSIS — Z3403 Encounter for supervision of normal first pregnancy, third trimester: Secondary | ICD-10-CM

## 2013-09-01 DIAGNOSIS — Z3400 Encounter for supervision of normal first pregnancy, unspecified trimester: Secondary | ICD-10-CM

## 2013-09-01 LAB — OB RESULTS CONSOLE GC/CHLAMYDIA
Chlamydia: NEGATIVE
GC PROBE AMP, GENITAL: NEGATIVE

## 2013-09-01 LAB — OB RESULTS CONSOLE GBS: GBS: NEGATIVE

## 2013-09-01 NOTE — Progress Notes (Signed)
Pelvic cultures done.  Presentation verified by bedside ultrasound.  No other complaints or concerns.  Fetal movement and labor precautions reviewed.

## 2013-09-01 NOTE — Progress Notes (Signed)
P-78 

## 2013-09-01 NOTE — Patient Instructions (Signed)
Return to clinic for any obstetric concerns or go to MAU for evaluation  

## 2013-09-02 LAB — GC/CHLAMYDIA PROBE AMP
CT Probe RNA: NEGATIVE
GC Probe RNA: NEGATIVE

## 2013-09-03 LAB — CULTURE, BETA STREP (GROUP B ONLY)

## 2013-09-07 ENCOUNTER — Ambulatory Visit (INDEPENDENT_AMBULATORY_CARE_PROVIDER_SITE_OTHER): Payer: Managed Care, Other (non HMO) | Admitting: Obstetrics & Gynecology

## 2013-09-07 VITALS — BP 125/88 | Wt 173.0 lb

## 2013-09-07 DIAGNOSIS — Z3400 Encounter for supervision of normal first pregnancy, unspecified trimester: Secondary | ICD-10-CM

## 2013-09-07 NOTE — Progress Notes (Signed)
GBS neg.  No other complaints or concerns.  Fetal movement and labor precautions reviewed. 

## 2013-09-07 NOTE — Progress Notes (Signed)
P = 107 

## 2013-09-07 NOTE — Patient Instructions (Signed)
Return to clinic for any obstetric concerns or go to MAU for evaluation  

## 2013-09-15 ENCOUNTER — Encounter: Payer: Self-pay | Admitting: Family Medicine

## 2013-09-15 ENCOUNTER — Ambulatory Visit (INDEPENDENT_AMBULATORY_CARE_PROVIDER_SITE_OTHER): Payer: Managed Care, Other (non HMO) | Admitting: Family Medicine

## 2013-09-15 VITALS — BP 118/76 | Wt 175.0 lb

## 2013-09-15 DIAGNOSIS — Z3400 Encounter for supervision of normal first pregnancy, unspecified trimester: Secondary | ICD-10-CM

## 2013-09-15 NOTE — Patient Instructions (Signed)
Third Trimester of Pregnancy The third trimester is from week 29 through week 42, months 7 through 9. The third trimester is a time when the fetus is growing rapidly. At the end of the ninth month, the fetus is about 20 inches in length and weighs 6 10 pounds.  BODY CHANGES Your body goes through many changes during pregnancy. The changes vary from woman to woman.   Your weight will continue to increase. You can expect to gain 25 35 pounds (11 16 kg) by the end of the pregnancy.  You may begin to get stretch marks on your hips, abdomen, and breasts.  You may urinate more often because the fetus is moving lower into your pelvis and pressing on your bladder.  You may develop or continue to have heartburn as a result of your pregnancy.  You may develop constipation because certain hormones are causing the muscles that push waste through your intestines to slow down.  You may develop hemorrhoids or swollen, bulging veins (varicose veins).  You may have pelvic pain because of the weight gain and pregnancy hormones relaxing your joints between the bones in your pelvis. Back aches may result from over exertion of the muscles supporting your posture.  Your breasts will continue to grow and be tender. A yellow discharge may leak from your breasts called colostrum.  Your belly button may stick out.  You may feel short of breath because of your expanding uterus.  You may notice the fetus "dropping," or moving lower in your abdomen.  You may have a bloody mucus discharge. This usually occurs a few days to a week before labor begins.  Your cervix becomes thin and soft (effaced) near your due date. WHAT TO EXPECT AT YOUR PRENATAL EXAMS  You will have prenatal exams every 2 weeks until week 36. Then, you will have weekly prenatal exams. During a routine prenatal visit:  You will be weighed to make sure you and the fetus are growing normally.  Your blood pressure is taken.  Your abdomen will  be measured to track your baby's growth.  The fetal heartbeat will be listened to.  Any test results from the previous visit will be discussed.  You may have a cervical check near your due date to see if you have effaced. At around 36 weeks, your caregiver will check your cervix. At the same time, your caregiver will also perform a test on the secretions of the vaginal tissue. This test is to determine if a type of bacteria, Group B streptococcus, is present. Your caregiver will explain this further. Your caregiver may ask you:  What your birth plan is.  How you are feeling.  If you are feeling the baby move.  If you have had any abnormal symptoms, such as leaking fluid, bleeding, severe headaches, or abdominal cramping.  If you have any questions. Other tests or screenings that may be performed during your third trimester include:  Blood tests that check for low iron levels (anemia).  Fetal testing to check the health, activity level, and growth of the fetus. Testing is done if you have certain medical conditions or if there are problems during the pregnancy. FALSE LABOR You may feel small, irregular contractions that eventually go away. These are called Braxton Hicks contractions, or false labor. Contractions may last for hours, days, or even weeks before true labor sets in. If contractions come at regular intervals, intensify, or become painful, it is best to be seen by your caregiver.    SIGNS OF LABOR   Menstrual-like cramps.  Contractions that are 5 minutes apart or less.  Contractions that start on the top of the uterus and spread down to the lower abdomen and back.  A sense of increased pelvic pressure or back pain.  A watery or bloody mucus discharge that comes from the vagina. If you have any of these signs before the 37th week of pregnancy, call your caregiver right away. You need to go to the hospital to get checked immediately. HOME CARE INSTRUCTIONS   Avoid all  smoking, herbs, alcohol, and unprescribed drugs. These chemicals affect the formation and growth of the baby.  Follow your caregiver's instructions regarding medicine use. There are medicines that are either safe or unsafe to take during pregnancy.  Exercise only as directed by your caregiver. Experiencing uterine cramps is a good sign to stop exercising.  Continue to eat regular, healthy meals.  Wear a good support bra for breast tenderness.  Do not use hot tubs, steam rooms, or saunas.  Wear your seat belt at all times when driving.  Avoid raw meat, uncooked cheese, cat litter boxes, and soil used by cats. These carry germs that can cause birth defects in the baby.  Take your prenatal vitamins.  Try taking a stool softener (if your caregiver approves) if you develop constipation. Eat more high-fiber foods, such as fresh vegetables or fruit and whole grains. Drink plenty of fluids to keep your urine clear or pale yellow.  Take warm sitz baths to soothe any pain or discomfort caused by hemorrhoids. Use hemorrhoid cream if your caregiver approves.  If you develop varicose veins, wear support hose. Elevate your feet for 15 minutes, 3 4 times a day. Limit salt in your diet.  Avoid heavy lifting, wear low heal shoes, and practice good posture.  Rest a lot with your legs elevated if you have leg cramps or low back pain.  Visit your dentist if you have not gone during your pregnancy. Use a soft toothbrush to brush your teeth and be gentle when you floss.  A sexual relationship may be continued unless your caregiver directs you otherwise.  Do not travel far distances unless it is absolutely necessary and only with the approval of your caregiver.  Take prenatal classes to understand, practice, and ask questions about the labor and delivery.  Make a trial run to the hospital.  Pack your hospital bag.  Prepare the baby's nursery.  Continue to go to all your prenatal visits as directed  by your caregiver. SEEK MEDICAL CARE IF:  You are unsure if you are in labor or if your water has broken.  You have dizziness.  You have mild pelvic cramps, pelvic pressure, or nagging pain in your abdominal area.  You have persistent nausea, vomiting, or diarrhea.  You have a bad smelling vaginal discharge.  You have pain with urination. SEEK IMMEDIATE MEDICAL CARE IF:   You have a fever.  You are leaking fluid from your vagina.  You have spotting or bleeding from your vagina.  You have severe abdominal cramping or pain.  You have rapid weight loss or gain.  You have shortness of breath with chest pain.  You notice sudden or extreme swelling of your face, hands, ankles, feet, or legs.  You have not felt your baby move in over an hour.  You have severe headaches that do not go away with medicine.  You have vision changes. Document Released: 08/06/2001 Document Revised: 04/14/2013 Document Reviewed:   10/13/2012 [employer] Patient Information 2014 [employer].  Breastfeeding Deciding to breastfeed is one of the best choices you can make for you and your baby. A change in hormones during pregnancy causes your breast tissue to grow and increases the number and size of your milk ducts. These hormones also allow proteins, sugars, and fats from your blood supply to make breast milk in your milk-producing glands. Hormones prevent breast milk from being released before your baby is born as well as prompt milk flow after birth. Once breastfeeding has begun, thoughts of your baby, as well as his or her sucking or crying, can stimulate the release of milk from your milk-producing glands.  BENEFITS OF BREASTFEEDING For Your Baby  Your first milk (colostrum) helps your baby's digestive system function better.   There are antibodies in your milk that help your baby fight off infections.   Your baby has a lower incidence of asthma, allergies, and sudden infant death syndrome.    The nutrients in breast milk are better for your baby than infant formulas and are designed uniquely for your baby's needs.   Breast milk improves your baby's brain development.   Your baby is less likely to develop other conditions, such as childhood obesity, asthma, or type 2 diabetes mellitus.  For You   Breastfeeding helps to create a very special bond between you and your baby.   Breastfeeding is convenient. Breast milk is always available at the correct temperature and costs nothing.   Breastfeeding helps to burn calories and helps you lose the weight gained during pregnancy.   Breastfeeding makes your uterus contract to its prepregnancy size faster and slows bleeding (lochia) after you give birth.   Breastfeeding helps to lower your risk of developing type 2 diabetes mellitus, osteoporosis, and breast or ovarian cancer later in life. SIGNS THAT YOUR BABY IS HUNGRY Early Signs of Hunger  Increased alertness or activity.  Stretching.  Movement of the head from side to side.  Movement of the head and opening of the mouth when the corner of the mouth or cheek is stroked (rooting).  Increased sucking sounds, smacking lips, cooing, sighing, or squeaking.  Hand-to-mouth movements.  Increased sucking of fingers or hands. Late Signs of Hunger  Fussing.  Intermittent crying. Extreme Signs of Hunger Signs of extreme hunger will require calming and consoling before your baby will be able to breastfeed successfully. Do not wait for the following signs of extreme hunger to occur before you initiate breastfeeding:   Restlessness.  A loud, strong cry.   Screaming. BREASTFEEDING BASICS Breastfeeding Initiation  Find a comfortable place to sit or lie down, with your neck and back well supported.  Place a pillow or rolled up blanket under your baby to bring him or her to the level of your breast (if you are seated). Nursing pillows are specially designed to help  support your arms and your baby while you breastfeed.  Make sure that your baby's abdomen is facing your abdomen.   Gently massage your breast. With your fingertips, massage from your chest wall toward your nipple in a circular motion. This encourages milk flow. You may need to continue this action during the feeding if your milk flows slowly.  Support your breast with 4 fingers underneath and your thumb above your nipple. Make sure your fingers are well away from your nipple and your baby's mouth.   Stroke your baby's lips gently with your finger or nipple.   When your baby's mouth is   open wide enough, quickly bring your baby to your breast, placing your entire nipple and as much of the colored area around your nipple (areola) as possible into your baby's mouth.   More areola should be visible above your baby's upper lip than below the lower lip.   Your baby's tongue should be between his or her lower gum and your breast.   Ensure that your baby's mouth is correctly positioned around your nipple (latched). Your baby's lips should create a seal on your breast and be turned out (everted).  It is common for your baby to suck about 2 3 minutes in order to start the flow of breast milk. Latching Teaching your baby how to latch on to your breast properly is very important. An improper latch can cause nipple pain and decreased milk supply for you and poor weight gain in your baby. Also, if your baby is not latched onto your nipple properly, he or she may swallow some air during feeding. This can make your baby fussy. Burping your baby when you switch breasts during the feeding can help to get rid of the air. However, teaching your baby to latch on properly is still the best way to prevent fussiness from swallowing air while breastfeeding. Signs that your baby has successfully latched on to your nipple:    Silent tugging or silent sucking, without causing you pain.   Swallowing heard  between every 3 4 sucks.    Muscle movement above and in front of his or her ears while sucking.  Signs that your baby has not successfully latched on to nipple:   Sucking sounds or smacking sounds from your baby while breastfeeding.  Nipple pain. If you think your baby has not latched on correctly, slip your finger into the corner of your baby's mouth to break the suction and place it between your baby's gums. Attempt breastfeeding initiation again. Signs of Successful Breastfeeding Signs from your baby:   A gradual decrease in the number of sucks or complete cessation of sucking.   Falling asleep.   Relaxation of his or her body.   Retention of a small amount of milk in his or her mouth.   Letting go of your breast by himself or herself. Signs from you:  Breasts that have increased in firmness, weight, and size 1 3 hours after feeding.   Breasts that are softer immediately after breastfeeding.  Increased milk volume, as well as a change in milk consistency and color by the 5th day of breastfeeding.   Nipples that are not sore, cracked, or bleeding. Signs That Your Baby is Getting Enough Milk  Wetting at least 3 diapers in a 24-hour period. The urine should be clear and pale yellow by age 5 days.  At least 3 stools in a 24-hour period by age 5 days. The stool should be soft and yellow.  At least 3 stools in a 24-hour period by age 7 days. The stool should be seedy and yellow.  No loss of weight greater than 10% of birth weight during the first 3 days of age.  Average weight gain of 4 7 ounces (120 210 mL) per week after age 4 days.  Consistent daily weight gain by age 5 days, without weight loss after the age of 2 weeks. After a feeding, your baby may spit up a small amount. This is common. BREASTFEEDING FREQUENCY AND DURATION Frequent feeding will help you make more milk and can prevent sore nipples and breast engorgement.   Breastfeed when you feel the need to  reduce the fullness of your breasts or when your baby shows signs of hunger. This is called "breastfeeding on demand." Avoid introducing a pacifier to your baby while you are working to establish breastfeeding (the first 4 6 weeks after your baby is born). After this time you may choose to use a pacifier. Research has shown that pacifier use during the first year of a baby's life decreases the risk of sudden infant death syndrome (SIDS). Allow your baby to feed on each breast as long as he or she wants. Breastfeed until your baby is finished feeding. When your baby unlatches or falls asleep while feeding from the first breast, offer the second breast. Because newborns are often sleepy in the first few weeks of life, you may need to awaken your baby to get him or her to feed. Breastfeeding times will vary from baby to baby. However, the following rules can serve as a guide to help you ensure that your baby is properly fed:  Newborns (babies 4 weeks of age or younger) may breastfeed every 1 3 hours.  Newborns should not go longer than 3 hours during the day or 5 hours during the night without breastfeeding.  You should breastfeed your baby a minimum of 8 times in a 24-hour period until you begin to introduce solid foods to your baby at around 6 months of age. BREAST MILK PUMPING Pumping and storing breast milk allows you to ensure that your baby is exclusively fed your breast milk, even at times when you are unable to breastfeed. This is especially important if you are going back to work while you are still breastfeeding or when you are not able to be present during feedings. Your lactation consultant can give you guidelines on how long it is safe to store breast milk.  A breast pump is a machine that allows you to pump milk from your breast into a sterile bottle. The pumped breast milk can then be stored in a refrigerator or freezer. Some breast pumps are operated by hand, while others use electricity. Ask  your lactation consultant which type will work best for you. Breast pumps can be purchased, but some hospitals and breastfeeding support groups lease breast pumps on a monthly basis. A lactation consultant can teach you how to hand express breast milk, if you prefer not to use a pump.  CARING FOR YOUR BREASTS WHILE YOU BREASTFEED Nipples can become dry, cracked, and sore while breastfeeding. The following recommendations can help keep your breasts moisturized and healthy:  Avoid using soap on your nipples.   Wear a supportive bra. Although not required, special nursing bras and tank tops are designed to allow access to your breasts for breastfeeding without taking off your entire bra or top. Avoid wearing underwire style bras or extremely tight bras.  Air dry your nipples for 3 4minutes after each feeding.   Use only cotton bra pads to absorb leaked breast milk. Leaking of breast milk between feedings is normal.   Use lanolin on your nipples after breastfeeding. Lanolin helps to maintain your skin's normal moisture barrier. If you use pure lanolin you do not need to wash it off before feeding your baby again. Pure lanolin is not toxic to your baby. You may also hand express a few drops of breast milk and gently massage that milk into your nipples and allow the milk to air dry. In the first few weeks after giving birth, some women   experience extremely full breasts (engorgement). Engorgement can make your breasts feel heavy, warm, and tender to the touch. Engorgement peaks within 3 5 days after you give birth. The following recommendations can help ease engorgement:  Completely empty your breasts while breastfeeding or pumping. You may want to start by applying warm, moist heat (in the shower or with warm water-soaked hand towels) just before feeding or pumping. This increases circulation and helps the milk flow. If your baby does not completely empty your breasts while breastfeeding, pump any extra  milk after he or she is finished.  Wear a snug bra (nursing or regular) or tank top for 1 2 days to signal your body to slightly decrease milk production.  Apply ice packs to your breasts, unless this is too uncomfortable for you.  Make sure that your baby is latched on and positioned properly while breastfeeding. If engorgement persists after 48 hours of following these recommendations, contact your health care provider or a lactation consultant. OVERALL HEALTH CARE RECOMMENDATIONS WHILE BREASTFEEDING  Eat healthy foods. Alternate between meals and snacks, eating 3 of each per day. Because what you eat affects your breast milk, some of the foods may make your baby more irritable than usual. Avoid eating these foods if you are sure that they are negatively affecting your baby.  Drink milk, fruit juice, and water to satisfy your thirst (about 10 glasses a day).   Rest often, relax, and continue to take your prenatal vitamins to prevent fatigue, stress, and anemia.  Continue breast self-awareness checks.  Avoid chewing and smoking tobacco.  Avoid alcohol and drug use. Some medicines that may be harmful to your baby can pass through breast milk. It is important to ask your health care provider before taking any medicine, including all over-the-counter and prescription medicine as well as vitamin and herbal supplements. It is possible to become pregnant while breastfeeding. If birth control is desired, ask your health care provider about options that will be safe for your baby. SEEK MEDICAL CARE IF:   You feel like you want to stop breastfeeding or have become frustrated with breastfeeding.  You have painful breasts or nipples.  Your nipples are cracked or bleeding.  Your breasts are red, tender, or warm.  You have a swollen area on either breast.  You have a fever or chills.  You have nausea or vomiting.  You have drainage other than breast milk from your nipples.  Your breasts  do not become full before feedings by the 5th day after you give birth.  You feel sad and depressed.  Your baby is too sleepy to eat well.  Your baby is having trouble sleeping.   Your baby is wetting less than 3 diapers in a 24-hour period.  Your baby has less than 3 stools in a 24-hour period.  Your baby's skin or the white part of his or her eyes becomes yellow.   Your baby is not gaining weight by 5 days of age. SEEK IMMEDIATE MEDICAL CARE IF:   Your baby is overly tired (lethargic) and does not want to wake up and feed.  Your baby develops an unexplained fever. Document Released: 08/12/2005 Document Revised: 04/14/2013 Document Reviewed: 02/03/2013 [employer] Patient Information 2014 [employer].  

## 2013-09-15 NOTE — Progress Notes (Signed)
P= 80 

## 2013-09-15 NOTE — Progress Notes (Signed)
Doing well. Labor precautions  

## 2013-09-22 ENCOUNTER — Encounter: Payer: Self-pay | Admitting: Obstetrics and Gynecology

## 2013-09-22 ENCOUNTER — Ambulatory Visit (INDEPENDENT_AMBULATORY_CARE_PROVIDER_SITE_OTHER): Payer: Managed Care, Other (non HMO) | Admitting: Obstetrics and Gynecology

## 2013-09-22 VITALS — BP 118/70 | Wt 176.0 lb

## 2013-09-22 DIAGNOSIS — Z3400 Encounter for supervision of normal first pregnancy, unspecified trimester: Secondary | ICD-10-CM

## 2013-09-22 NOTE — Progress Notes (Signed)
P - 97 

## 2013-09-22 NOTE — Progress Notes (Signed)
Patient is doing well without complaints. FM/labor precautions reviewed. Membranes stripped. All questions answered. Will plan for postdate testing at next visit if undelivered

## 2013-09-23 ENCOUNTER — Ambulatory Visit (INDEPENDENT_AMBULATORY_CARE_PROVIDER_SITE_OTHER): Payer: Managed Care, Other (non HMO) | Admitting: Obstetrics & Gynecology

## 2013-09-23 ENCOUNTER — Encounter: Payer: Self-pay | Admitting: Obstetrics & Gynecology

## 2013-09-23 VITALS — BP 110/70 | Wt 176.0 lb

## 2013-09-23 DIAGNOSIS — O368190 Decreased fetal movements, unspecified trimester, not applicable or unspecified: Secondary | ICD-10-CM

## 2013-09-23 DIAGNOSIS — Z3400 Encounter for supervision of normal first pregnancy, unspecified trimester: Secondary | ICD-10-CM

## 2013-09-23 NOTE — Progress Notes (Signed)
Work in visit due to decreased fetal movement. NST reviewed and reactive. She now feels good FM. Labor precautions reviewed.

## 2013-09-26 ENCOUNTER — Inpatient Hospital Stay (HOSPITAL_COMMUNITY): Payer: Managed Care, Other (non HMO) | Admitting: Anesthesiology

## 2013-09-26 ENCOUNTER — Encounter (HOSPITAL_COMMUNITY): Payer: Self-pay | Admitting: *Deleted

## 2013-09-26 ENCOUNTER — Encounter (HOSPITAL_COMMUNITY): Payer: Managed Care, Other (non HMO) | Admitting: Anesthesiology

## 2013-09-26 ENCOUNTER — Inpatient Hospital Stay (HOSPITAL_COMMUNITY)
Admission: AD | Admit: 2013-09-26 | Discharge: 2013-09-28 | DRG: 775 | Disposition: A | Discharge status: Home / Self Care | Payer: Managed Care, Other (non HMO) | Source: Ambulatory Visit | Attending: Obstetrics & Gynecology | Admitting: Obstetrics & Gynecology

## 2013-09-26 DIAGNOSIS — Z3400 Encounter for supervision of normal first pregnancy, unspecified trimester: Secondary | ICD-10-CM

## 2013-09-26 DIAGNOSIS — O4290 Premature rupture of membranes, unspecified as to length of time between rupture and onset of labor, unspecified weeks of gestation: Principal | ICD-10-CM | POA: Diagnosis present

## 2013-09-26 LAB — CBC
HCT: 36.9 % (ref 36.0–46.0)
HEMOGLOBIN: 12.7 g/dL (ref 12.0–15.0)
MCH: 31.9 pg (ref 26.0–34.0)
MCHC: 34.4 g/dL (ref 30.0–36.0)
MCV: 92.7 fL (ref 78.0–100.0)
PLATELETS: 198 10*3/uL (ref 150–400)
RBC: 3.98 MIL/uL (ref 3.87–5.11)
RDW: 13 % (ref 11.5–15.5)
WBC: 11.9 10*3/uL — ABNORMAL HIGH (ref 4.0–10.5)

## 2013-09-26 LAB — URINALYSIS, ROUTINE W REFLEX MICROSCOPIC
BILIRUBIN URINE: NEGATIVE
Glucose, UA: NEGATIVE mg/dL
Hgb urine dipstick: NEGATIVE
Ketones, ur: NEGATIVE mg/dL
Leukocytes, UA: NEGATIVE
Nitrite: NEGATIVE
PH: 5.5 (ref 5.0–8.0)
PROTEIN: NEGATIVE mg/dL
SPECIFIC GRAVITY, URINE: 1.01 (ref 1.005–1.030)
UROBILINOGEN UA: 0.2 mg/dL (ref 0.0–1.0)

## 2013-09-26 LAB — AMNISURE RUPTURE OF MEMBRANE (ROM) NOT AT ARMC: AMNISURE: POSITIVE

## 2013-09-26 LAB — RPR: RPR: NONREACTIVE

## 2013-09-26 MED ORDER — LACTATED RINGERS IV SOLN: 500.0000 mL | Freq: Once | INTRAVENOUS | Status: DC

## 2013-09-26 MED ORDER — LIDOCAINE HCL (PF) 1 % IJ SOLN
30.0000 mL | INTRAMUSCULAR | Status: DC | PRN
  Filled 2013-09-26 (×2): qty 30

## 2013-09-26 MED ORDER — CITRIC ACID-SODIUM CITRATE 334-500 MG/5ML PO SOLN: 30.0000 mL | ORAL | Status: DC | PRN

## 2013-09-26 MED ORDER — TERBUTALINE SULFATE 1 MG/ML IJ SOLN: 0.2500 mg | Freq: Once | INTRAMUSCULAR | Status: AC | PRN

## 2013-09-26 MED ORDER — EPHEDRINE 5 MG/ML INJ
10.0000 mg | INTRAVENOUS | Status: DC | PRN
  Filled 2013-09-26: qty 2

## 2013-09-26 MED ORDER — ACETAMINOPHEN 325 MG PO TABS: 650.0000 mg | ORAL_TABLET | ORAL | Status: DC | PRN

## 2013-09-26 MED ORDER — FENTANYL CITRATE 0.05 MG/ML IJ SOLN
100.0000 ug | INTRAMUSCULAR | Status: DC | PRN
  Administered 2013-09-26: 100 ug via INTRAVENOUS
  Filled 2013-09-26: qty 2

## 2013-09-26 MED ORDER — EPHEDRINE 5 MG/ML INJ
10.0000 mg | INTRAVENOUS | Status: DC | PRN
  Filled 2013-09-26: qty 4
  Filled 2013-09-26: qty 2

## 2013-09-26 MED ORDER — OXYTOCIN BOLUS FROM INFUSION
500.0000 mL | INTRAVENOUS | Status: DC
  Administered 2013-09-27: 500 mL via INTRAVENOUS

## 2013-09-26 MED ORDER — LACTATED RINGERS IV SOLN
INTRAVENOUS | Status: DC
  Administered 2013-09-26 – 2013-09-27 (×3): via INTRAVENOUS

## 2013-09-26 MED ORDER — OXYTOCIN 40 UNITS IN LACTATED RINGERS INFUSION - SIMPLE MED
1.0000 m[IU]/min | INTRAVENOUS | Status: DC
  Administered 2013-09-26: 2 m[IU]/min via INTRAVENOUS

## 2013-09-26 MED ORDER — ONDANSETRON HCL 4 MG/2ML IJ SOLN
4.0000 mg | Freq: Four times a day (QID) | INTRAMUSCULAR | Status: DC | PRN
Start: 2013-09-26 — End: 2013-09-27

## 2013-09-26 MED ORDER — PHENYLEPHRINE 40 MCG/ML (10ML) SYRINGE FOR IV PUSH (FOR BLOOD PRESSURE SUPPORT)
80.0000 ug | PREFILLED_SYRINGE | INTRAVENOUS | Status: DC | PRN
  Filled 2013-09-26: qty 10
  Filled 2013-09-26: qty 2

## 2013-09-26 MED ORDER — PHENYLEPHRINE 40 MCG/ML (10ML) SYRINGE FOR IV PUSH (FOR BLOOD PRESSURE SUPPORT)
80.0000 ug | PREFILLED_SYRINGE | INTRAVENOUS | Status: DC | PRN
  Filled 2013-09-26: qty 2

## 2013-09-26 MED ORDER — FENTANYL 2.5 MCG/ML BUPIVACAINE 1/10 % EPIDURAL INFUSION (WH - ANES)
14.0000 mL/h | INTRAMUSCULAR | Status: DC | PRN
  Administered 2013-09-27: 14 mL/h via EPIDURAL
  Filled 2013-09-26 (×2): qty 125

## 2013-09-26 MED ORDER — FENTANYL 2.5 MCG/ML BUPIVACAINE 1/10 % EPIDURAL INFUSION (WH - ANES)
INTRAMUSCULAR | Status: DC | PRN
  Administered 2013-09-26: 14 mL/h via EPIDURAL

## 2013-09-26 MED ORDER — OXYTOCIN 40 UNITS IN LACTATED RINGERS INFUSION - SIMPLE MED
62.5000 mL/h | INTRAVENOUS | Status: DC
  Filled 2013-09-26: qty 2000

## 2013-09-26 MED ORDER — LACTATED RINGERS IV SOLN
500.0000 mL | INTRAVENOUS | Status: DC | PRN
  Administered 2013-09-27 (×2): 300 mL via INTRAVENOUS

## 2013-09-26 MED ORDER — IBUPROFEN 600 MG PO TABS
600.0000 mg | ORAL_TABLET | Freq: Four times a day (QID) | ORAL | Status: DC | PRN
  Administered 2013-09-27: 600 mg via ORAL
  Filled 2013-09-26: qty 1

## 2013-09-26 MED ORDER — DIPHENHYDRAMINE HCL 50 MG/ML IJ SOLN: 12.5000 mg | INTRAMUSCULAR | Status: DC | PRN

## 2013-09-26 MED ORDER — LIDOCAINE HCL (PF) 1 % IJ SOLN
INTRAMUSCULAR | Status: DC | PRN
  Administered 2013-09-26 (×2): 4 mL

## 2013-09-26 MED ORDER — OXYCODONE-ACETAMINOPHEN 5-325 MG PO TABS: 1.0000 | ORAL_TABLET | ORAL | Status: DC | PRN

## 2013-09-26 NOTE — Progress Notes (Signed)
Sedra Delynn FlavinWilson Blanchard is a 28 y.o. G3P0020 at [id number] admitted for induction of labor due to PROM.  Subjective: Pt doing well, starting to have painful frequent ctx, walking without issues  Objective: BP 122/74  Pulse 91  Temp(Src) 98.9 F (37.2 C) (Oral)  Resp 18  Ht 5\' 5"  (1.651 m)  Wt 80.287 kg (177 lb)  BMI 29.45 kg/m2  LMP 12/21/2012      FHT:  FHR: 140s bpm, variability: moderate,  accelerations:  Present,  decelerations:  Absent UC:  Reg 2-424min SVE:   Dilation: 1.5 Effacement (%): 50 Station: -3 Exam by:: dr Samel Bruna Not reexamined, FB still in, small amount of bloody fluid in tube. Labs: Lab Results  Component Value Date   WBC 11.9* 09/26/2013   HGB 12.7 09/26/2013   HCT 36.9 09/26/2013   MCV 92.7 09/26/2013   PLT 198 09/26/2013    Assessment / Plan: Induction of labor due to PROM,  FB placed @ 1200 and still in place.  Labor: Now having painful ctx, will continue to monitor. If FB not out in 2 hrs will start pit. Preeclampsia:  no signs or symptoms of toxicity Fetal Wellbeing:  Category I Pain Control:  Labor support without medications I/D:  n/a Anticipated MOD:  NSVD  Irineo Gaulin RYAN 09/26/2013, 3:57 PM

## 2013-09-26 NOTE — MAU Provider Note (Signed)
Attestation of Attending Supervision of Advanced Practitioner (PA/CNM/NP): Evaluation and management procedures were performed by the Advanced Practitioner under my supervision and collaboration.  I have reviewed the Advanced Practitioner's note and chart, and I agree with the management and plan.  Reva BoresPRATT,Harlean Regula S, MD [facility] Attending 09/26/2013 9:04 AM

## 2013-09-26 NOTE — Anesthesia Procedure Notes (Signed)
Epidural Patient location during procedure: OB Start time: 09/26/2013 8:30 PM End time: 09/26/2013 8:50 PM  Staffing Anesthesiologist: Lewie LoronGERMEROTH, Heinrich Fertig R Performed by: anesthesiologist   Preanesthetic Checklist Completed: patient identified, pre-op evaluation, timeout performed, IV checked, risks and benefits discussed and monitors and equipment checked  Epidural Patient position: sitting Prep: site prepped and draped and DuraPrep Patient monitoring: heart rate Approach: midline Injection technique: LOR air and LOR saline  Needle:  Needle type: Tuohy  Needle gauge: 17 G Needle length: 9 cm Needle insertion depth: 6 cm Catheter type: closed end flexible Catheter size: 19 Gauge Catheter at skin depth: 12 cm Test dose: negative  Assessment Sensory level: T8 Events: blood not aspirated, injection not painful, no injection resistance, negative IV test and no paresthesia  Additional Notes Reason for block:procedure for pain

## 2013-09-26 NOTE — MAU Provider Note (Signed)
Client reports gush of fluid at home when getting out of bed.  Reports urination every 30 minutes yesterday. Speculum exam done. Minimal yellow discharge seen.  No active leaking.  No leaking with valsalva. A:  Most likely no ROM P: Amnisure done for confirmation. Will get Urinalysis to R/O UTI

## 2013-09-26 NOTE — Anesthesia Preprocedure Evaluation (Signed)
Anesthesia Evaluation  Patient identified by MRN, date of birth, ID band Patient awake    Reviewed: Allergy & Precautions, H&P , NPO status , Patient's Chart, lab work & pertinent test results  Airway Mallampati: II TM Distance: >3 FB Neck ROM: full    Dental no notable dental hx. (+) Teeth Intact   Pulmonary neg pulmonary ROS,    Pulmonary exam normal       Cardiovascular + Valvular Problems/Murmurs Rhythm:regular Rate:Normal     Neuro/Psych negative neurological ROS  negative psych ROS   GI/Hepatic negative GI ROS, Neg liver ROS,   Endo/Other  negative endocrine ROS  Renal/GU negative Renal ROS     Musculoskeletal negative musculoskeletal ROS (+)   Abdominal Normal abdominal exam  (+)   Peds  Hematology negative hematology ROS (+)   Anesthesia Other Findings   Reproductive/Obstetrics (+) Pregnancy                           Anesthesia Physical  Anesthesia Plan  ASA: II  Anesthesia Plan: Epidural   Post-op Pain Management:    Induction:   Airway Management Planned:   Additional Equipment:   Intra-op Plan:   Post-operative Plan:   Informed Consent: I have reviewed the patients History and Physical, chart, labs and discussed the procedure including the risks, benefits and alternatives for the proposed anesthesia with the patient or authorized representative who has indicated his/her understanding and acceptance.     Plan Discussed with:   Anesthesia Plan Comments:         Anesthesia Quick Evaluation

## 2013-09-26 NOTE — Progress Notes (Signed)
[name] is a 28 y.o. G3P0020 at 7034w6d admitted for induction of labor due to PROM.  Subjective: Pt doing well, requests pain meds  Objective: BP 115/90  Pulse 79  Temp(Src) 98.9 F (37.2 C) (Oral)  Resp 18  Ht 5\' 5"  (1.651 m)  Wt 80.287 kg (177 lb)  BMI 29.45 kg/m2  LMP 12/21/2012      FHT:  FHR: 130s bpm, variability: moderate,  accelerations:  Present,  decelerations:  Absent UC:  Reg 2-473min SVE:   Dilation: 1.5 Effacement (%): 50 Station: -3 Exam by:: dr Karliah Kowalchuk Reexamined, FB still in with cervix around, starting to thin out Labs: Lab Results  Component Value Date   WBC 11.9* 09/26/2013   HGB 12.7 09/26/2013   HCT 36.9 09/26/2013   MCV 92.7 09/26/2013   PLT 198 09/26/2013    Assessment / Plan: Induction of labor due to PROM,  FB placed @ 1200 and still in place.  Labor: Now having painful ctx, will continue to monitor. On pitocin up to 6mU Preeclampsia:  no signs or symptoms of toxicity Fetal Wellbeing:  Category I Pain Control:  Fentanyl I/D:  n/a Anticipated MOD:  NSVD  Keitha Kolk RYAN 09/26/2013, 6:42 PM

## 2013-09-26 NOTE — Progress Notes (Signed)
[name] is a 28 y.o. G3P0020 at 257w6d by ultrasound admitted for rupture of membranes.    Subjective: Resting comfortably with epidural.  Patient's husband and sister at bedside.  Objective: BP 107/67  Pulse 84  Temp(Src) 98.4 F (36.9 C) (Oral)  Resp 18  Ht 5\' 5"  (1.651 m)  Wt 80.287 kg (177 lb)  BMI 29.45 kg/m2  SpO2 99%  LMP 12/21/2012      FHT:  FHR: 135 bpm, variability: moderate,  accelerations:  Present,  decelerations:  Present early decels  UC:   regular, every 3-3.5 minutes SVE:   Dilation: 6.5 Effacement (%): 80 Station: -2 Exam by:: [facility]. [facility]uttle[facility]  Labs: Lab Results  Component Value Date   WBC 11.9* 09/26/2013   HGB 12.7 09/26/2013   HCT 36.9 09/26/2013   MCV 92.7 09/26/2013   PLT 198 09/26/2013    Assessment / Plan: Augmentation of labor, progressing well on pitocin. Foley bulb is out.  Labor: Progressing normally Preeclampsia:  n/a Fetal Wellbeing:  Category I Pain Control:  Epidural I/D:  GBS neg Anticipated MOD:  NSVD  Quincy SimmondsFeeney, Patricia L 09/26/2013, 11:00 PM  I have seen and examined this patient and agree with above documentation in the resident's note.   Rulon AbideKeli Ikenna Ohms, M.D. [facility] 09/26/2013 11:31 PM

## 2013-09-26 NOTE — Progress Notes (Signed)
Patty, RN answered for Dr. Ike Benedom, notified pt's amnisure is positive.

## 2013-09-26 NOTE — MAU Note (Signed)
Pt presents with complaints of leakage of fluid that started this morning around 6am. Denies any bleeding but states some mild contractions.

## 2013-09-26 NOTE — H&P (Signed)
[name] is a 28 y.o. female G3P0020 with IUP at [redacted]w[redacted]d presenting for SROM at 0600 this morning. Pt states she has been having irregular, every 10-15 minutes contractions, associated with no vaginal bleeding.  Membranes are intact, ruptured, clear fluid, with active fetal movement.   [facility] 10 [facility]ks  Prenatal History/Complications:  Past Medical History: Past Medical History  Diagnosis Date  . Heart murmur   . Supervision of normal first pregnancy 02/19/2013    [facility] patient  Genetics: Anatomy: Glucola: GBS: Contraception: Feeding:     Past Surgical History: Past Surgical History  Procedure Laterality Date  . Mole removal      on back  . Wisdom tooth extraction      x 2  . Dilation and evacuation  07/17/2012    Procedure: DILATATION AND EVACUATION;  Surgeon: [name], MD;  Location: [facility];  Service: Gynecology;  Laterality: N/A;  chromosome studies    Obstetrical History: OB History   Grav Para Term Preterm Abortions TAB SAB Ect Mult Living   3 0 0 0 2 0 2 0 0 0       Gynecological History: OB History   Grav Para Term Preterm Abortions TAB SAB Ect Mult Living   3 0 0 0 2 0 2 0 0 0       Social History: History   Social History  . Marital Status: Married    Spouse Name: N/A    Number of Children: N/A  . Years of Education: N/A   Social History Main Topics  . Smoking status: Never Smoker   . Smokeless tobacco: Never Used  . Alcohol Use: No  . Drug Use: No  . Sexual Activity: Yes    Partners: Male    Birth Control/ Protection: None   Other Topics Concern  . None   Social History Narrative  . None    Family History: Family History  Problem Relation Age of Onset  . Alzheimer's disease Maternal Grandmother   . Osteoporosis Maternal Grandmother   . Hyperlipidemia Maternal Grandmother   . Stroke Paternal Grandfather   . Brain cancer Maternal Grandfather     Allergies: No Known Allergies  Prescriptions  prior to admission  Medication Sig Dispense Refill  . beta carotene 16109 UNIT capsule Take 10,000 Units by mouth daily.      . Biotin 5 MG CAPS Take 5 mg by mouth daily.      . Calcium Carbonate Antacid (TUMS ULTRA 1000 PO) Take 1 tablet by mouth as needed. heartburn      . folic acid (FOLVITE) 400 MCG tablet Take 400 mcg by mouth daily.      Marland Kitchen guaifenesin (ROBITUSSIN) 100 MG/5ML syrup Take 200 mg by mouth 4 (four) times daily as needed for cough or congestion.      . Multiple Vitamin (MULTIVITAMIN WITH MINERALS) TABS Take 3 tablets by mouth daily. Takes 3 gummies "Alive" daily.  This is the recommended dose.         Review of Systems   At baseline, no ha, vision changes, SOB, Abdominal pain, urinary or bowel sympotms.  Blood pressure 125/75, pulse 80, temperature 98.9 F (37.2 C), temperature source Oral, resp. rate 18, height 5\' 5"  (1.651 m), weight 80.287 kg (177 lb), last menstrual period 12/21/2012. General appearance: alert, cooperative, appears stated age and no distress Lungs: clear to auscultation bilaterally Heart: regular rate and rhythm Abdomen: soft, non-tender; bowel sounds normal Pelvic: adequate Extremities: Homans sign  is negative, no sign of DVT Presentation: cephalic Fetal monitoringBaseline: 140s bpm, Variability: Good {> 6 bpm), Accelerations: Reactive and Decelerations: Absent Uterine activity irregular q10-15  Dilation: 1.5 Effacement (%): 60 Station: -3 Exam by:: SBeck, RN   Prenatal labs: ABO, Rh: O/POS/-- (06/27 1132) Antibody: NEG (06/27 1132) Rubella:   RPR: NON REAC (11/12 1439)  HBsAg: NEGATIVE (06/27 1132)  HIV: NON REACTIVE (11/12 1439)  GBS: Negative (01/07 0000)     Prenatal Transfer Tool  Maternal Diabetes: No Genetic Screening: Normal Maternal Ultrasounds/Referrals: Normal Fetal Ultrasounds or other Referrals:  None Maternal Substance Abuse:  No Significant Maternal Medications:  None Significant Maternal Lab Results: Lab values  include: Group B Strep negative  [place] patient  Genetics: First screen: normal   MSAFP: normal Anatomy: normal Flu: Declines vaccine Tdap:  07/07/13 Glucola:99 GBS: negative Contraception: Nothing Feeding: Breast Pediatrician: [facility]    Results for orders placed during the hospital encounter of 09/26/13 (from the past 24 hour(s))  AMNISURE RUPTURE OF MEMBRANE (ROM)   Collection Time    09/26/13  8:30 AM      Result Value Range   Amnisure ROM POSITIVE    URINALYSIS, ROUTINE W REFLEX MICROSCOPIC   Collection Time    09/26/13  8:32 AM      Result Value Range   Color, Urine YELLOW  YELLOW   APPearance CLEAR  CLEAR   Specific Gravity, Urine 1.010  1.005 - 1.030   pH 5.5  5.0 - 8.0   Glucose, UA NEGATIVE  NEGATIVE mg/dL   Hgb urine dipstick NEGATIVE  NEGATIVE   Bilirubin Urine NEGATIVE  NEGATIVE   Ketones, ur NEGATIVE  NEGATIVE mg/dL   Protein, ur NEGATIVE  NEGATIVE mg/dL   Urobilinogen, UA 0.2  0.0 - 1.0 mg/dL   Nitrite NEGATIVE  NEGATIVE   Leukocytes, UA NEGATIVE  NEGATIVE    Assessment: [name] is a 28 y.o. G3P0020 at 3731w6d by L=12 here for SROM at 0600 #Labor:If no change at 6hr post rupture, will augment, likely start with FB. May consider cyottec as well PRN #Pain: Desires no meds at this time #FWB: Cat I #ID:  GBS neg #MOF: Pumping #MOC: Declines #Circ:  Declines  Orange Hilligoss RYAN 09/26/2013, 10:55 AM

## 2013-09-26 NOTE — Progress Notes (Signed)
[name] is a 28 y.o. G3P0020 at 5155w6d admitted for induction of labor due to PROM.  Subjective: Pt doing well, no significant ctx, walking without issues  Objective: BP 134/81  Pulse 98  Temp(Src) 98.9 F (37.2 C) (Oral)  Resp 18  Ht 5\' 5"  (1.651 m)  Wt 80.287 kg (177 lb)  BMI 29.45 kg/m2  LMP 12/21/2012      FHT:  FHR: 130s bpm, variability: moderate,  accelerations:   ,  decelerations:  Absent UC:   irregular, every 10 minutes SVE:   Dilation: 1.5 Effacement (%): 50 Station: -3 Exam by:: dr Naureen Benton  Labs: Lab Results  Component Value Date   WBC 11.9* 09/26/2013   HGB 12.7 09/26/2013   HCT 36.9 09/26/2013   MCV 92.7 09/26/2013   PLT 198 09/26/2013    Assessment / Plan: Induction of labor due to PROM,  FB placed @ 1200  Labor: If no change in 4 hr will continue induction with pit up to 6mU while FB in place Preeclampsia:  no signs or symptoms of toxicity Fetal Wellbeing:  Category I Pain Control:  Labor support without medications I/D:  n/a Anticipated MOD:  NSVD  [name] 09/26/2013, 12:20 PM

## 2013-09-26 NOTE — H&P (Signed)
Attestation of Attending Supervision of Advanced Practitioner (PA/CNM/NP): Evaluation and management procedures were performed by the Advanced Practitioner under my supervision and collaboration.  I have reviewed the Advanced Practitioner's note and chart, and I agree with the management and plan.  Dionisios Ricci S, MD [facility] Attending 09/26/2013 3:17 PM  

## 2013-09-26 NOTE — MAU Note (Signed)
Pt states woke up this am around 0200 and bed was wet. Got up around 0600 and noted trickle of fluid, then larger amount of fluid leaking out. Clear, non-odorous. Did appear mucus like. No bleeding

## 2013-09-27 ENCOUNTER — Encounter (HOSPITAL_COMMUNITY): Payer: Self-pay

## 2013-09-27 DIAGNOSIS — O4290 Premature rupture of membranes, unspecified as to length of time between rupture and onset of labor, unspecified weeks of gestation: Secondary | ICD-10-CM

## 2013-09-27 LAB — TYPE AND SCREEN
ABO/RH(D): O POS
Antibody Screen: NEGATIVE

## 2013-09-27 MED ORDER — ONDANSETRON HCL 4 MG PO TABS: 4.0000 mg | ORAL_TABLET | ORAL | Status: DC | PRN

## 2013-09-27 MED ORDER — ZOLPIDEM TARTRATE 5 MG PO TABS: 5.0000 mg | ORAL_TABLET | Freq: Every evening | ORAL | Status: DC | PRN

## 2013-09-27 MED ORDER — ONDANSETRON HCL 4 MG/2ML IJ SOLN
4.0000 mg | INTRAMUSCULAR | Status: DC | PRN
Start: 2013-09-27 — End: 2013-09-28

## 2013-09-27 MED ORDER — PRENATAL MULTIVITAMIN CH
1.0000 | ORAL_TABLET | Freq: Every day | ORAL | Status: DC
  Administered 2013-09-27: 1 via ORAL
  Filled 2013-09-27: qty 1

## 2013-09-27 MED ORDER — WITCH HAZEL-GLYCERIN EX PADS
1.0000 "application " | MEDICATED_PAD | CUTANEOUS | Status: DC | PRN
  Administered 2013-09-27: 1 via TOPICAL

## 2013-09-27 MED ORDER — DIPHENHYDRAMINE HCL 25 MG PO CAPS: 25.0000 mg | ORAL_CAPSULE | Freq: Four times a day (QID) | ORAL | Status: DC | PRN

## 2013-09-27 MED ORDER — SIMETHICONE 80 MG PO CHEW
80.0000 mg | CHEWABLE_TABLET | ORAL | Status: DC | PRN
Start: 2013-09-27 — End: 2013-09-28

## 2013-09-27 MED ORDER — TETANUS-DIPHTH-ACELL PERTUSSIS 5-2.5-18.5 LF-MCG/0.5 IM SUSP: 0.5000 mL | Freq: Once | INTRAMUSCULAR | Status: DC

## 2013-09-27 MED ORDER — IBUPROFEN 600 MG PO TABS
600.0000 mg | ORAL_TABLET | Freq: Four times a day (QID) | ORAL | Status: DC
  Administered 2013-09-27 – 2013-09-28 (×3): 600 mg via ORAL
  Filled 2013-09-27 (×3): qty 1

## 2013-09-27 MED ORDER — BENZOCAINE-MENTHOL 20-0.5 % EX AERO
1.0000 "application " | INHALATION_SPRAY | CUTANEOUS | Status: DC | PRN
  Administered 2013-09-27: 1 via TOPICAL
  Filled 2013-09-27: qty 56

## 2013-09-27 MED ORDER — OXYCODONE-ACETAMINOPHEN 5-325 MG PO TABS: 1.0000 | ORAL_TABLET | ORAL | Status: DC | PRN

## 2013-09-27 MED ORDER — SENNOSIDES-DOCUSATE SODIUM 8.6-50 MG PO TABS
2.0000 | ORAL_TABLET | ORAL | Status: DC
  Administered 2013-09-28: 2 via ORAL
  Filled 2013-09-27: qty 2

## 2013-09-27 MED ORDER — DIBUCAINE 1 % RE OINT
1.0000 "application " | TOPICAL_OINTMENT | RECTAL | Status: DC | PRN
  Administered 2013-09-27: 1 via RECTAL
  Filled 2013-09-27: qty 28

## 2013-09-27 MED ORDER — LANOLIN HYDROUS EX OINT: TOPICAL_OINTMENT | CUTANEOUS | Status: DC | PRN

## 2013-09-27 NOTE — Progress Notes (Signed)
[name] is a 28 y.o. G3P0020 at 782w0d  admitted for rupture of membranes  Subjective:  Starting to feel pressure with contractions.  +FM.  Objective: BP 130/85  Pulse 116  Temp(Src) 99.5 F (37.5 C) (Oral)  Resp 20  Ht 5\' 5"  (1.651 m)  Wt 80.287 kg (177 lb)  BMI 29.45 kg/m2  SpO2 99%  LMP 12/21/2012      FHT:  FHR: 150 bpm, variability: moderate,  accelerations:  Present,  decelerations:  Present variables UC:   regular, every 3-4 minutes SVE:   Dilation: 10 Effacement (%): 100 Station: +1;0 Exam by:: Dr. Reola CalkinsBeck  Labs: Lab Results  Component Value Date   WBC 11.9* 09/26/2013   HGB 12.7 09/26/2013   HCT 36.9 09/26/2013   MCV 92.7 09/26/2013   PLT 198 09/26/2013    Assessment / Plan: IOL due to PROM. progressing well  Labor: finally starting to drop in station.  will wait until her pressure is more constant and then start pushing.   Fetal Wellbeing:  Category II Pain Control:  Epidural I/D:  n/a Anticipated MOD:  NSVD  Starlene Consuegra L 09/27/2013, 5:46 AM

## 2013-09-27 NOTE — Progress Notes (Signed)
Diane CalkinsBeck, MD, notified of FHR decelerations while pushing. Notified of interventions, not much progress being made, and pt feeling minimal pressure. Pt to rest.

## 2013-09-27 NOTE — Progress Notes (Signed)
Patient ID: [name], female   DOB: 04-19-1986, 28 y.o.   MRN: [id number] [name] is a 28 y.o. G3P0020 at 7331w0d admitted for PROM  Subjective: Comfortable between UCs and has urge to push. Pushing well.   Objective: BP 134/84  Pulse 122  Temp(Src) 98.8 F (37.1 C) (Oral)  Resp 20  Ht 5\' 5"  (1.651 m)  Wt 80.287 kg (177 lb)  BMI 29.45 kg/m2  SpO2 99%  LMP 12/21/2012  Fetal Heart FHR: 150 bpm, variability: moderate,  accelerations:  Present,  decelerations:  Present mild pushing variables, prompt return   Contractions:   SVE:   Dilation: 10 Effacement (%): 100 Station: 0;+1 Exam by:: [name], CNM Dr. Dolan Allen Assessment / Plan:  Labor: Active 2nd stage Fetal Wellbeing: Category 2 Pain Control:  adequate Expected mode of delivery: Expect immminent NSVD  [name] 09/27/2013, 10:34 AM

## 2013-09-27 NOTE — Progress Notes (Signed)
[name] is a 28 y.o. G3P0020 at 4568w0d  admitted for rupture of membranes  Subjective:  Doing well. Feeling some more pressure.  +FM.   Objective: BP 104/78  Pulse 93  Temp(Src) 99.2 F (37.3 C) (Oral)  Resp 18  Ht 5\' 5"  (1.651 m)  Wt 80.287 kg (177 lb)  BMI 29.45 kg/m2  SpO2 99%  LMP 12/21/2012      FHT:  FHR: 135 bpm, variability: moderate,  accelerations:  Present,  decelerations:  Present occasional variables UC:   regular, every 2-3 minutes SVE:   Dilation: 8 Effacement (%): 90 Station: -2 Exam by:: A. Tuttle, [facility]  Labs: Lab Results  Component Value Date   WBC 11.9* 09/26/2013   HGB 12.7 09/26/2013   HCT 36.9 09/26/2013   MCV 92.7 09/26/2013   PLT 198 09/26/2013    Assessment / Plan: IOL for PROM  Labor: progressing on pitocin. no change to current management Fetal Wellbeing:  Category II Pain Control:  Epidural I/D:  n/a Anticipated MOD:  NSVD  [name] 09/27/2013, 2:02 AM

## 2013-09-27 NOTE — Progress Notes (Signed)
Patient ID: [name], female   DOB: 1986/08/12, 28 y.o.   MRN: [id number] [name] is a 28 y.o. G3P0020 at 2964w0d admitted for IOL indicated by PROM  Subjective: Comfortable with epidural.  "Rectal pressure" with UCs  but no urge to push. Complete x about 3 hrs, pushed 8a-9a. Fatigued and anxious.   Objective: BP 124/80  Pulse 87  Temp(Src) 98.8 F (37.1 C) (Oral)  Resp 20  Ht 5\' 5"  (1.651 m)  Wt 80.287 kg (177 lb)  BMI 29.45 kg/m2  SpO2 100%  LMP 12/21/2012 Abd: EFW 7 1/2-8#  Fetal Heart FHR: 145-150 bpm, variability: moderate,  accelerations:  Present,  decelerations:  Present variables to 90 with pushing, retained variability, some with slow return 30-60 sec   Contractions: q2 1/2-3 with pitocin at 6 mu  SVE:   Dilation: 10 Effacement (%): 100 Station: 0;+1 Exam by:: Darica Goren, CNM Caput 2 cm, ROP, manual rotation 45 degrees>large amount clear AF, spontaneously returned to ROP, +FSS  Assessment / Plan:  Labor: Second stage protracted without urge to push> exaggerated Sims and decrease epidural infusion rate to 1/2 Fetal Wellbeing: Category 2 Pain Control:  adequate Expected mode of delivery: NSVD  Lean Jaeger 09/27/2013, 9:25 AM

## 2013-09-28 MED ORDER — IBUPROFEN 600 MG PO TABS: 600.0000 mg | ORAL_TABLET | Freq: Four times a day (QID) | ORAL | Status: DC

## 2013-09-28 NOTE — Discharge Summary (Signed)
Attestation of Attending Supervision of Fellow: Evaluation and management procedures were performed by the Fellow under my supervision and collaboration.  I have reviewed the Fellow's note and chart, and I agree with the management and plan.    

## 2013-09-28 NOTE — Lactation Note (Signed)
This note was copied from the chart of [name]. Lactation Consultation Note Follow up consult:  Baby Diane 3823 hours old and sleeping in mother's arms.  Mother states it hurts when baby starts to breastfeed.  Nipples WNL, no cracks or bleeding.  Provided mother with comfort gels and reviewed use.  Reviewed engorgement care, mastitis, and lactation support services.  Encouraged mother to call with next feeding.   Patient Name: [name] ZOXWR'UToday's Date: 09/28/2013 Reason for consult: Follow-up assessment   Maternal Data    Feeding Feeding Type: Breast Fed Length of feed: 20 min  LATCH Score/Interventions                      Lactation Tools Discussed/Used     Consult Status Consult Status: Follow-up Date: 09/28/13 Follow-up type: In-patient    Dahlia ByesBerkelhammer, Layliana Devins [facility] 09/28/2013, 9:46 AM

## 2013-09-28 NOTE — Discharge Instructions (Signed)

## 2013-09-28 NOTE — Discharge Summary (Signed)
Obstetric Discharge Summary Reason for Admission: rupture of membranes Prenatal Procedures: NST Intrapartum Procedures: spontaneous vaginal delivery Postpartum Procedures: none Complications-Operative and Postpartum: vaginal, sulcal and labial tears Hemoglobin  Date Value Range Status  09/26/2013 12.7  12.0 - 15.0 g/dL Final     HCT  Date Value Range Status  09/26/2013 36.9  36.0 - 46.0 % Final    Physical Exam:  General: alert, cooperative and no distress Lochia: appropriate Uterine Fundus: firm Incision: na DVT Evaluation: No evidence of DVT seen on physical exam. No cords or calf tenderness. No significant calf/ankle edema.  Discharge Diagnoses: Term Pregnancy-delivered  Discharge Information: Date: 09/28/2013 Activity: pelvic rest Diet: routine Medications: PNV and Ibuprofen Condition: stable Instructions: refer to practice specific booklet Discharge to: home Follow-up Information   [facility] Schedule an appointment as soon as possible for a visit in 4 weeks.   Specialty:  Obstetrics and Gynecology   Contact information:   [address] [place] [id number] [phone]      Newborn Data: Live born female  Birth Weight: 7 lb 11.8 oz (3510 g) APGAR: , 8  Home with mother.  Pt presented with PROM and was induced to eventually deliver a liveborn female via NSVD. Post partum care has been uncomplicated. She is breast feeding and desires no contraception.   [name] L 09/28/2013, 8:30 AM

## 2013-09-28 NOTE — Anesthesia Postprocedure Evaluation (Signed)
  Anesthesia Post-op Note  Patient: [name]  Procedure(s) Performed: * No procedures listed *  Patient Location: Mother/Baby  Anesthesia Type:Epidural  Level of Consciousness: awake  Airway and Oxygen Therapy: Patient Spontanous Breathing  Post-op Pain: mild  Post-op Assessment: Patient's Cardiovascular Status Stable and Respiratory Function Stable  Post-op Vital Signs: stable  Complications: No apparent anesthesia complications

## 2013-09-29 ENCOUNTER — Encounter (HOSPITAL_COMMUNITY): Payer: Self-pay | Admitting: *Deleted

## 2013-09-30 ENCOUNTER — Encounter: Payer: Managed Care, Other (non HMO) | Admitting: Obstetrics & Gynecology

## 2013-10-27 ENCOUNTER — Ambulatory Visit (INDEPENDENT_AMBULATORY_CARE_PROVIDER_SITE_OTHER): Payer: Managed Care, Other (non HMO) | Admitting: Obstetrics & Gynecology

## 2013-10-27 ENCOUNTER — Encounter: Payer: Self-pay | Admitting: Obstetrics & Gynecology

## 2013-10-27 DIAGNOSIS — K649 Unspecified hemorrhoids: Secondary | ICD-10-CM

## 2013-10-27 DIAGNOSIS — O878 Other venous complications in the puerperium: Secondary | ICD-10-CM

## 2013-10-27 DIAGNOSIS — O2240 Hemorrhoids in pregnancy, unspecified trimester: Secondary | ICD-10-CM

## 2013-10-27 MED ORDER — HYDROCORTISONE 2.5 % RE CREA: 1.0000 "application " | TOPICAL_CREAM | Freq: Two times a day (BID) | RECTAL | Status: DC

## 2013-10-27 NOTE — Progress Notes (Signed)
    Subjective:     [name] is a 28 y.o. [phone] female who presents for a postpartum visit. She is 4 weeks postpartum following a spontaneous vaginal delivery. I have fully reviewed the prenatal and intrapartum course. The delivery was at 40 gestational weeks.  Complications: bilateral sulcal lacerations, left labium minorum full thickness laceration. Anesthesia: epidural. Postpartum course has been uncomplicated. Baby's course has been uncomplicated. Baby is feeding by breast. Bleeding no bleeding. Bowel function is normal but has hemorrhoids that are symptomatic.  Bladder function is normal. Patient is not sexually active. Contraception method is none. Postpartum depression screening: negative.  The following portions of the patient's history were reviewed and updated as appropriate: allergies, current medications, past family history, past medical history, past social history, past surgical history and problem list.  Normal pap in 02/19/13.  Review of Systems Pertinent items are noted in HPI.   Objective:    BP 125/93  Pulse 94  Ht 5\' 5"  (1.651 m)  Wt 158 lb (71.668 kg)  BMI 26.29 kg/m2  Breastfeeding? Yes  General:  alert and no distress   Breasts:  inspection negative, no nipple discharge or bleeding, no masses or nodularity palpable  Lungs: clear to auscultation bilaterally  Heart:  regular rate and rhythm  Abdomen: soft, non-tender; bowel sounds normal; no masses,  no organomegaly   Vulva:  normal  Vagina: normal vagina  Cervix:  no lesions and nulliparous appearance  Corpus: normal size, contour, position, consistency, mobility, non-tender  Adnexa:  normal adnexa and no mass, fullness, tenderness  Rectal Exam: Normal rectovaginal exam        Assessment:   Normal postpartum exam. Pap smear not done at today's visit.   Plan:   1. Contraception: none 2. Anusol prescribed as needed for hemorrhoids 3. Follow up as needed.

## 2014-05-11 ENCOUNTER — Ambulatory Visit (INDEPENDENT_AMBULATORY_CARE_PROVIDER_SITE_OTHER): Payer: Managed Care, Other (non HMO) | Admitting: Obstetrics and Gynecology

## 2014-05-11 ENCOUNTER — Encounter: Payer: Self-pay | Admitting: Obstetrics and Gynecology

## 2014-05-11 ENCOUNTER — Other Ambulatory Visit: Payer: Self-pay | Admitting: Obstetrics and Gynecology

## 2014-05-11 VITALS — BP 136/88 | HR 83 | Wt 154.0 lb

## 2014-05-11 DIAGNOSIS — Z113 Encounter for screening for infections with a predominantly sexual mode of transmission: Secondary | ICD-10-CM

## 2014-05-11 DIAGNOSIS — Z3480 Encounter for supervision of other normal pregnancy, unspecified trimester: Secondary | ICD-10-CM | POA: Insufficient documentation

## 2014-05-11 DIAGNOSIS — Z3481 Encounter for supervision of other normal pregnancy, first trimester: Secondary | ICD-10-CM

## 2014-05-11 DIAGNOSIS — O09899 Supervision of other high risk pregnancies, unspecified trimester: Secondary | ICD-10-CM

## 2014-05-11 NOTE — Progress Notes (Signed)
   Subjective:    [name] is a U9W1191 [redacted]w[redacted]d being seen today for her first obstetrical visit.  Her obstetrical history is significant for short interval between pregnancy (last pregnancy 09/2013). Patient does not intend to breast feed. Pregnancy history fully reviewed.  Patient reports nausea.  There were no vitals filed for this visit.  HISTORY: OB History  Gravida Para Term Preterm AB SAB TAB Ectopic Multiple Living  0 2 2 0 0 0 1    # Outcome Date GA Lbr Len/2nd Weight Sex Delivery Anes PTL Lv  4 CUR           3 TRM 09/27/13 [redacted]w[redacted]d 23:00 / 05:38 7 lb 11.8 oz (3.51 kg) M SVD EPI  Y  2 SAB           1 SAB              Past Medical History  Diagnosis Date  . Heart murmur   . Supervision of normal first pregnancy 02/19/2013    [facility] patient  Genetics: Anatomy: Glucola: GBS: Contraception: Feeding:    Past Surgical History  Procedure Laterality Date  . Mole removal      on back  . Wisdom tooth extraction      x 2  . Dilation and evacuation  07/17/2012    Procedure: DILATATION AND EVACUATION;  Surgeon: [name], MD;  Location: [facility];  Service: Gynecology;  Laterality: N/A;  chromosome studies   Family History  Problem Relation Age of Onset  . Alzheimer's disease Maternal Grandmother   . Osteoporosis Maternal Grandmother   . Hyperlipidemia Maternal Grandmother   . Stroke Paternal Grandfather   . Brain cancer Maternal Grandfather      Exam    Uterus:     Pelvic Exam:    Perineum: Normal Perineum   Vulva: normal   Vagina:  normal mucosa, normal discharge   pH:    Cervix: closed and long   Adnexa: normal adnexa and no mass, fullness, tenderness   Bony Pelvis: gynecoid  System: Breast:  normal appearance, no masses or tenderness   Skin: normal coloration and turgor, no rashes    Neurologic: oriented, no focal deficits   Extremities: normal strength, tone, and muscle mass   HEENT extra ocular movement intact   Mouth/Teeth  mucous membranes moist, pharynx normal without lesions and dental hygiene good   Neck supple and no masses   Cardiovascular: regular rate and rhythm   Respiratory:  chest clear, no wheezing, crepitations, rhonchi, normal symmetric air entry   Abdomen: soft, non-tender; bowel sounds normal; no masses,  no organomegaly   Urinary:       Assessment:    Pregnancy: [id number] Patient Active Problem List   Diagnosis Date Noted  . Supervision of other normal pregnancy 05/11/2014    Priority: Medium  . Short interval between pregnancies affecting pregnancy, antepartum 05/11/2014    Priority: Medium  . NSVD (normal spontaneous vaginal delivery) 09/27/2013        Plan:     Initial labs drawn. Prenatal vitamins. Problem list reviewed and updated. Genetic Screening discussed First Screen: requested.  Ultrasound discussed; fetal survey: requested. Patient declined antiemetics Ultrasound shows an IUP at 7w  Follow up in 4 weeks. 50% of 30 min visit spent on counseling and coordination of care.     Kwaku Mostafa 05/11/2014

## 2014-05-11 NOTE — Patient Instructions (Signed)
First Trimester of Pregnancy The first trimester of pregnancy is from week 1 until the end of week 12 (months 1 through 3). A week after a sperm fertilizes an egg, the egg will implant on the wall of the uterus. This embryo will begin to develop into a baby. Genes from you and your partner are forming the baby. The female genes determine whether the baby is a boy or a girl. At 6-8 weeks, the eyes and face are formed, and the heartbeat can be seen on ultrasound. At the end of 12 weeks, all the baby's organs are formed.  Now that you are pregnant, you will want to do everything you can to have a healthy baby. Two of the most important things are to get good prenatal care and to follow your health care provider's instructions. Prenatal care is all the medical care you receive before the baby's birth. This care will help prevent, find, and treat any problems during the pregnancy and childbirth. BODY CHANGES Your body goes through many changes during pregnancy. The changes vary from woman to woman.   You may gain or lose a couple of pounds at first.  You may feel sick to your stomach (nauseous) and throw up (vomit). If the vomiting is uncontrollable, call your health care provider.  You may tire easily.  You may develop headaches that can be relieved by medicines approved by your health care provider.  You may urinate more often. Painful urination may mean you have a bladder infection.  You may develop heartburn as a result of your pregnancy.  You may develop constipation because certain hormones are causing the muscles that push waste through your intestines to slow down.  You may develop hemorrhoids or swollen, bulging veins (varicose veins).  Your breasts may begin to grow larger and become tender. Your nipples may stick out more, and the tissue that surrounds them (areola) may become darker.  Your gums may bleed and may be sensitive to brushing and flossing.  Dark spots or blotches  (chloasma, mask of pregnancy) may develop on your face. This will likely fade after the baby is born.  Your menstrual periods will stop.  You may have a loss of appetite.  You may develop cravings for certain kinds of food.  You may have changes in your emotions from day to day, such as being excited to be pregnant or being concerned that something may go wrong with the pregnancy and baby.  You may have more vivid and strange dreams.  You may have changes in your hair. These can include thickening of your hair, rapid growth, and changes in texture. Some women also have hair loss during or after pregnancy, or hair that feels dry or thin. Your hair will most likely return to normal after your baby is born. WHAT TO EXPECT AT YOUR PRENATAL VISITS During a routine prenatal visit:  You will be weighed to make sure you and the baby are growing normally.  Your blood pressure will be taken.  Your abdomen will be measured to track your baby's growth.  The fetal heartbeat will be listened to starting around week 10 or 12 of your pregnancy.  Test results from any previous visits will be discussed. Your health care provider may ask you:  How you are feeling.  If you are feeling the baby move.  If you have had any abnormal symptoms, such as leaking fluid, bleeding, severe headaches, or abdominal cramping.  If you have any questions. Other tests   that may be performed during your first trimester include:  Blood tests to find your blood type and to check for the presence of any previous infections. They will also be used to check for low iron levels (anemia) and Rh antibodies. Later in the pregnancy, blood tests for diabetes will be done along with other tests if problems develop.  Urine tests to check for infections, diabetes, or protein in the urine.  An ultrasound to confirm the proper growth and development of the baby.  An amniocentesis to check for possible genetic problems.  Fetal  screens for spina bifida and Down syndrome.  You may need other tests to make sure you and the baby are doing well. HOME CARE INSTRUCTIONS  Medicines  Follow your health care provider's instructions regarding medicine use. Specific medicines may be either safe or unsafe to take during pregnancy.  Take your prenatal vitamins as directed.  If you develop constipation, try taking a stool softener if your health care provider approves. Diet  Eat regular, well-balanced meals. Choose a variety of foods, such as meat or vegetable-based protein, fish, milk and low-fat dairy products, vegetables, fruits, and whole grain breads and cereals. Your health care provider will help you determine the amount of weight gain that is right for you.  Avoid raw meat and uncooked cheese. These carry germs that can cause birth defects in the baby.  Eating four or five small meals rather than three large meals a day may help relieve nausea and vomiting. If you start to feel nauseous, eating a few soda crackers can be helpful. Drinking liquids between meals instead of during meals also seems to help nausea and vomiting.  If you develop constipation, eat more high-fiber foods, such as fresh vegetables or fruit and whole grains. Drink enough fluids to keep your urine clear or pale yellow. Activity and Exercise  Exercise only as directed by your health care provider. Exercising will help you:  Control your weight.  Stay in shape.  Be prepared for labor and delivery.  Experiencing pain or cramping in the lower abdomen or low back is a good sign that you should stop exercising. Check with your health care provider before continuing normal exercises.  Try to avoid standing for long periods of time. Move your legs often if you must stand in one place for a long time.  Avoid heavy lifting.  Wear low-heeled shoes, and practice good posture.  You may continue to have sex unless your health care provider directs you  otherwise. Relief of Pain or Discomfort  Wear a good support bra for breast tenderness.   Take warm sitz baths to soothe any pain or discomfort caused by hemorrhoids. Use hemorrhoid cream if your health care provider approves.   Rest with your legs elevated if you have leg cramps or low back pain.  If you develop varicose veins in your legs, wear support hose. Elevate your feet for 15 minutes, 3-4 times a day. Limit salt in your diet. Prenatal Care  Schedule your prenatal visits by the twelfth week of pregnancy. They are usually scheduled monthly at first, then more often in the last 2 months before delivery.  Write down your questions. Take them to your prenatal visits.  Keep all your prenatal visits as directed by your health care provider. Safety  Wear your seat belt at all times when driving.  Make a list of emergency phone numbers, including numbers for family, friends, the hospital, and police and fire departments. General Tips    Ask your health care provider for a referral to a local prenatal education class. Begin classes no later than at the beginning of month 6 of your pregnancy.  Ask for help if you have counseling or nutritional needs during pregnancy. Your health care provider can offer advice or refer you to specialists for help with various needs.  Do not use hot tubs, steam rooms, or saunas.  Do not douche or use tampons or scented sanitary pads.  Do not cross your legs for long periods of time.  Avoid cat litter boxes and soil used by cats. These carry germs that can cause birth defects in the baby and possibly loss of the fetus by miscarriage or stillbirth.  Avoid all smoking, herbs, alcohol, and medicines not prescribed by your health care provider. Chemicals in these affect the formation and growth of the baby.  Schedule a dentist appointment. At home, brush your teeth with a soft toothbrush and be gentle when you floss. SEEK MEDICAL CARE IF:   You have  dizziness.  You have mild pelvic cramps, pelvic pressure, or nagging pain in the abdominal area.  You have persistent nausea, vomiting, or diarrhea.  You have a bad smelling vaginal discharge.  You have pain with urination.  You notice increased swelling in your face, hands, legs, or ankles. SEEK IMMEDIATE MEDICAL CARE IF:   You have a fever.  You are leaking fluid from your vagina.  You have spotting or bleeding from your vagina.  You have severe abdominal cramping or pain.  You have rapid weight gain or loss.  You vomit blood or material that looks like coffee grounds.  You are exposed to German measles and have never had them.  You are exposed to fifth disease or chickenpox.  You develop a severe headache.  You have shortness of breath.  You have any kind of trauma, such as from a fall or a car accident. Document Released: 08/06/2001 Document Revised: 12/27/2013 Document Reviewed: 06/22/2013 [employer] Patient Information 2015 [employer]. This information is not intended to replace advice given to you by your health care provider. Make sure you discuss any questions you have with your health care provider.  Contraception Choices Contraception (birth control) is the use of any methods or devices to prevent pregnancy. Below are some methods to help avoid pregnancy. HORMONAL METHODS   Contraceptive implant. This is a thin, plastic tube containing progesterone hormone. It does not contain estrogen hormone. Your health care provider inserts the tube in the inner part of the upper arm. The tube can remain in place for up to 3 years. After 3 years, the implant must be removed. The implant prevents the ovaries from releasing an egg (ovulation), thickens the cervical mucus to prevent sperm from entering the uterus, and thins the lining of the inside of the uterus.  Progesterone-only injections. These injections are given every 3 months by your health care provider to prevent  pregnancy. This synthetic progesterone hormone stops the ovaries from releasing eggs. It also thickens cervical mucus and changes the uterine lining. This makes it harder for sperm to survive in the uterus.  Birth control pills. These pills contain estrogen and progesterone hormone. They work by preventing the ovaries from releasing eggs (ovulation). They also cause the cervical mucus to thicken, preventing the sperm from entering the uterus. Birth control pills are prescribed by a health care provider.Birth control pills can also be used to treat heavy periods.  Minipill. This type of birth control pill contains   only the progesterone hormone. They are taken every day of each month and must be prescribed by your health care provider.  Birth control patch. The patch contains hormones similar to those in birth control pills. It must be changed once a week and is prescribed by a health care provider.  Vaginal ring. The ring contains hormones similar to those in birth control pills. It is left in the vagina for 3 weeks, removed for 1 week, and then a new one is put back in place. The patient must be comfortable inserting and removing the ring from the vagina.A health care provider's prescription is necessary.  Emergency contraception. Emergency contraceptives prevent pregnancy after unprotected sexual intercourse. This pill can be taken right after sex or up to 5 days after unprotected sex. It is most effective the sooner you take the pills after having sexual intercourse. Most emergency contraceptive pills are available without a prescription. Check with your pharmacist. Do not use emergency contraception as your only form of birth control. BARRIER METHODS   Female condom. This is a thin sheath (latex or rubber) that is worn over the penis during sexual intercourse. It can be used with spermicide to increase effectiveness.  Female condom. This is a soft, loose-fitting sheath that is put into the vagina  before sexual intercourse.  Diaphragm. This is a soft, latex, dome-shaped barrier that must be fitted by a health care provider. It is inserted into the vagina, along with a spermicidal jelly. It is inserted before intercourse. The diaphragm should be left in the vagina for 6 to 8 hours after intercourse.  Cervical cap. This is a round, soft, latex or plastic cup that fits over the cervix and must be fitted by a health care provider. The cap can be left in place for up to 48 hours after intercourse.  Sponge. This is a soft, circular piece of polyurethane foam. The sponge has spermicide in it. It is inserted into the vagina after wetting it and before sexual intercourse.  Spermicides. These are chemicals that kill or block sperm from entering the cervix and uterus. They come in the form of creams, jellies, suppositories, foam, or tablets. They do not require a prescription. They are inserted into the vagina with an applicator before having sexual intercourse. The process must be repeated every time you have sexual intercourse. INTRAUTERINE CONTRACEPTION  Intrauterine device (IUD). This is a T-shaped device that is put in a woman's uterus during a menstrual period to prevent pregnancy. There are 2 types:  Copper IUD. This type of IUD is wrapped in copper wire and is placed inside the uterus. Copper makes the uterus and fallopian tubes produce a fluid that kills sperm. It can stay in place for 10 years.  Hormone IUD. This type of IUD contains the hormone progestin (synthetic progesterone). The hormone thickens the cervical mucus and prevents sperm from entering the uterus, and it also thins the uterine lining to prevent implantation of a fertilized egg. The hormone can weaken or kill the sperm that get into the uterus. It can stay in place for 3-5 years, depending on which type of IUD is used. PERMANENT METHODS OF CONTRACEPTION  Female tubal ligation. This is when the woman's fallopian tubes are  surgically sealed, tied, or blocked to prevent the egg from traveling to the uterus.  Hysteroscopic sterilization. This involves placing a small coil or insert into each fallopian tube. Your doctor uses a technique called hysteroscopy to do the procedure. The device causes scar tissue   to form. This results in permanent blockage of the fallopian tubes, so the sperm cannot fertilize the egg. It takes about 3 months after the procedure for the tubes to become blocked. You must use another form of birth control for these 3 months.  Female sterilization. This is when the female has the tubes that carry sperm tied off (vasectomy).This blocks sperm from entering the vagina during sexual intercourse. After the procedure, the man can still ejaculate fluid (semen). NATURAL PLANNING METHODS  Natural family planning. This is not having sexual intercourse or using a barrier method (condom, diaphragm, cervical cap) on days the woman could become pregnant.  Calendar method. This is keeping track of the length of each menstrual cycle and identifying when you are fertile.  Ovulation method. This is avoiding sexual intercourse during ovulation.  Symptothermal method. This is avoiding sexual intercourse during ovulation, using a thermometer and ovulation symptoms.  Post-ovulation method. This is timing sexual intercourse after you have ovulated. Regardless of which type or method of contraception you choose, it is important that you use condoms to protect against the transmission of sexually transmitted infections (STIs). Talk with your health care provider about which form of contraception is most appropriate for you. Document Released: 08/12/2005 Document Revised: 08/17/2013 Document Reviewed: 02/04/2013 [employer] Patient Information 2015 [employer]. This information is not intended to replace advice given to you by your health care provider. Make sure you discuss any questions you have with your health care  provider.  Breastfeeding Deciding to breastfeed is one of the best choices you can make for you and your baby. A change in hormones during pregnancy causes your breast tissue to grow and increases the number and size of your milk ducts. These hormones also allow proteins, sugars, and fats from your blood supply to make breast milk in your milk-producing glands. Hormones prevent breast milk from being released before your baby is born as well as prompt milk flow after birth. Once breastfeeding has begun, thoughts of your baby, as well as his or her sucking or crying, can stimulate the release of milk from your milk-producing glands.  BENEFITS OF BREASTFEEDING For Your Baby  Your first milk (colostrum) helps your baby's digestive system function better.   There are antibodies in your milk that help your baby fight off infections.   Your baby has a lower incidence of asthma, allergies, and sudden infant death syndrome.   The nutrients in breast milk are better for your baby than infant formulas and are designed uniquely for your baby's needs.   Breast milk improves your baby's brain development.   Your baby is less likely to develop other conditions, such as childhood obesity, asthma, or type 2 diabetes mellitus.  For You   Breastfeeding helps to create a very special bond between you and your baby.   Breastfeeding is convenient. Breast milk is always available at the correct temperature and costs nothing.   Breastfeeding helps to burn calories and helps you lose the weight gained during pregnancy.   Breastfeeding makes your uterus contract to its prepregnancy size faster and slows bleeding (lochia) after you give birth.   Breastfeeding helps to lower your risk of developing type 2 diabetes mellitus, osteoporosis, and breast or ovarian cancer later in life. SIGNS THAT YOUR BABY IS HUNGRY Early Signs of Hunger  Increased alertness or activity.  Stretching.  Movement of the  head from side to side.  Movement of the head and opening of the mouth when the corner   of the mouth or cheek is stroked (rooting).  Increased sucking sounds, smacking lips, cooing, sighing, or squeaking.  Hand-to-mouth movements.  Increased sucking of fingers or hands. Late Signs of Hunger  Fussing.  Intermittent crying. Extreme Signs of Hunger Signs of extreme hunger will require calming and consoling before your baby will be able to breastfeed successfully. Do not wait for the following signs of extreme hunger to occur before you initiate breastfeeding:   Restlessness.  A loud, strong cry.   Screaming. BREASTFEEDING BASICS Breastfeeding Initiation  Find a comfortable place to sit or lie down, with your neck and back well supported.  Place a pillow or rolled up blanket under your baby to bring him or her to the level of your breast (if you are seated). Nursing pillows are specially designed to help support your arms and your baby while you breastfeed.  Make sure that your baby's abdomen is facing your abdomen.   Gently massage your breast. With your fingertips, massage from your chest wall toward your nipple in a circular motion. This encourages milk flow. You may need to continue this action during the feeding if your milk flows slowly.  Support your breast with 4 fingers underneath and your thumb above your nipple. Make sure your fingers are well away from your nipple and your baby's mouth.   Stroke your baby's lips gently with your finger or nipple.   When your baby's mouth is open wide enough, quickly bring your baby to your breast, placing your entire nipple and as much of the colored area around your nipple (areola) as possible into your baby's mouth.   More areola should be visible above your baby's upper lip than below the lower lip.   Your baby's tongue should be between his or her lower gum and your breast.   Ensure that your baby's mouth is correctly  positioned around your nipple (latched). Your baby's lips should create a seal on your breast and be turned out (everted).  It is common for your baby to suck about 2-3 minutes in order to start the flow of breast milk. Latching Teaching your baby how to latch on to your breast properly is very important. An improper latch can cause nipple pain and decreased milk supply for you and poor weight gain in your baby. Also, if your baby is not latched onto your nipple properly, he or she may swallow some air during feeding. This can make your baby fussy. Burping your baby when you switch breasts during the feeding can help to get rid of the air. However, teaching your baby to latch on properly is still the best way to prevent fussiness from swallowing air while breastfeeding. Signs that your baby has successfully latched on to your nipple:    Silent tugging or silent sucking, without causing you pain.   Swallowing heard between every 3-4 sucks.    Muscle movement above and in front of his or her ears while sucking.  Signs that your baby has not successfully latched on to nipple:   Sucking sounds or smacking sounds from your baby while breastfeeding.  Nipple pain. If you think your baby has not latched on correctly, slip your finger into the corner of your baby's mouth to break the suction and place it between your baby's gums. Attempt breastfeeding initiation again. Signs of Successful Breastfeeding Signs from your baby:   A gradual decrease in the number of sucks or complete cessation of sucking.   Falling asleep.     Relaxation of his or her body.   Retention of a small amount of milk in his or her mouth.   Letting go of your breast by himself or herself. Signs from you:  Breasts that have increased in firmness, weight, and size 1-3 hours after feeding.   Breasts that are softer immediately after breastfeeding.  Increased milk volume, as well as a change in milk consistency  and color by the fifth day of breastfeeding.   Nipples that are not sore, cracked, or bleeding. Signs That Your Baby is Getting Enough Milk  Wetting at least 3 diapers in a 24-hour period. The urine should be clear and pale yellow by age 5 days.  At least 3 stools in a 24-hour period by age 5 days. The stool should be soft and yellow.  At least 3 stools in a 24-hour period by age 7 days. The stool should be seedy and yellow.  No loss of weight greater than 10% of birth weight during the first 3 days of age.  Average weight gain of 4-7 ounces (113-198 g) per week after age 4 days.  Consistent daily weight gain by age 5 days, without weight loss after the age of 2 weeks. After a feeding, your baby may spit up a small amount. This is common. BREASTFEEDING FREQUENCY AND DURATION Frequent feeding will help you make more milk and can prevent sore nipples and breast engorgement. Breastfeed when you feel the need to reduce the fullness of your breasts or when your baby shows signs of hunger. This is called "breastfeeding on demand." Avoid introducing a pacifier to your baby while you are working to establish breastfeeding (the first 4-6 weeks after your baby is born). After this time you may choose to use a pacifier. Research has shown that pacifier use during the first year of a baby's life decreases the risk of sudden infant death syndrome (SIDS). Allow your baby to feed on each breast as long as he or she wants. Breastfeed until your baby is finished feeding. When your baby unlatches or falls asleep while feeding from the first breast, offer the second breast. Because newborns are often sleepy in the first few weeks of life, you may need to awaken your baby to get him or her to feed. Breastfeeding times will vary from baby to baby. However, the following rules can serve as a guide to help you ensure that your baby is properly fed:  Newborns (babies 4 weeks of age or younger) may breastfeed every  1-3 hours.  Newborns should not go longer than 3 hours during the day or 5 hours during the night without breastfeeding.  You should breastfeed your baby a minimum of 8 times in a 24-hour period until you begin to introduce solid foods to your baby at around 6 months of age. BREAST MILK PUMPING Pumping and storing breast milk allows you to ensure that your baby is exclusively fed your breast milk, even at times when you are unable to breastfeed. This is especially important if you are going back to work while you are still breastfeeding or when you are not able to be present during feedings. Your lactation consultant can give you guidelines on how long it is safe to store breast milk.  A breast pump is a machine that allows you to pump milk from your breast into a sterile bottle. The pumped breast milk can then be stored in a refrigerator or freezer. Some breast pumps are operated by hand, while others   use electricity. Ask your lactation consultant which type will work best for you. Breast pumps can be purchased, but some hospitals and breastfeeding support groups lease breast pumps on a monthly basis. A lactation consultant can teach you how to hand express breast milk, if you prefer not to use a pump.  CARING FOR YOUR BREASTS WHILE YOU BREASTFEED Nipples can become dry, cracked, and sore while breastfeeding. The following recommendations can help keep your breasts moisturized and healthy:  Avoid using soap on your nipples.   Wear a supportive bra. Although not required, special nursing bras and tank tops are designed to allow access to your breasts for breastfeeding without taking off your entire bra or top. Avoid wearing underwire-style bras or extremely tight bras.  Air dry your nipples for 3-4minutes after each feeding.   Use only cotton bra pads to absorb leaked breast milk. Leaking of breast milk between feedings is normal.   Use lanolin on your nipples after breastfeeding. Lanolin  helps to maintain your skin's normal moisture barrier. If you use pure lanolin, you do not need to wash it off before feeding your baby again. Pure lanolin is not toxic to your baby. You may also hand express a few drops of breast milk and gently massage that milk into your nipples and allow the milk to air dry. In the first few weeks after giving birth, some women experience extremely full breasts (engorgement). Engorgement can make your breasts feel heavy, warm, and tender to the touch. Engorgement peaks within 3-5 days after you give birth. The following recommendations can help ease engorgement:  Completely empty your breasts while breastfeeding or pumping. You may want to start by applying warm, moist heat (in the shower or with warm water-soaked hand towels) just before feeding or pumping. This increases circulation and helps the milk flow. If your baby does not completely empty your breasts while breastfeeding, pump any extra milk after he or she is finished.  Wear a snug bra (nursing or regular) or tank top for 1-2 days to signal your body to slightly decrease milk production.  Apply ice packs to your breasts, unless this is too uncomfortable for you.  Make sure that your baby is latched on and positioned properly while breastfeeding. If engorgement persists after 48 hours of following these recommendations, contact your health care provider or a lactation consultant. OVERALL HEALTH CARE RECOMMENDATIONS WHILE BREASTFEEDING  Eat healthy foods. Alternate between meals and snacks, eating 3 of each per day. Because what you eat affects your breast milk, some of the foods may make your baby more irritable than usual. Avoid eating these foods if you are sure that they are negatively affecting your baby.  Drink milk, fruit juice, and water to satisfy your thirst (about 10 glasses a day).   Rest often, relax, and continue to take your prenatal vitamins to prevent fatigue, stress, and  anemia.  Continue breast self-awareness checks.  Avoid chewing and smoking tobacco.  Avoid alcohol and drug use. Some medicines that may be harmful to your baby can pass through breast milk. It is important to ask your health care provider before taking any medicine, including all over-the-counter and prescription medicine as well as vitamin and herbal supplements. It is possible to become pregnant while breastfeeding. If birth control is desired, ask your health care provider about options that will be safe for your baby. SEEK MEDICAL CARE IF:   You feel like you want to stop breastfeeding or have become frustrated with breastfeeding.    You have painful breasts or nipples.  Your nipples are cracked or bleeding.  Your breasts are red, tender, or warm.  You have a swollen area on either breast.  You have a fever or chills.  You have nausea or vomiting.  You have drainage other than breast milk from your nipples.  Your breasts do not become full before feedings by the fifth day after you give birth.  You feel sad and depressed.  Your baby is too sleepy to eat well.  Your baby is having trouble sleeping.   Your baby is wetting less than 3 diapers in a 24-hour period.  Your baby has less than 3 stools in a 24-hour period.  Your baby's skin or the white part of his or her eyes becomes yellow.   Your baby is not gaining weight by 5 days of age. SEEK IMMEDIATE MEDICAL CARE IF:   Your baby is overly tired (lethargic) and does not want to wake up and feed.  Your baby develops an unexplained fever. Document Released: 08/12/2005 Document Revised: 08/17/2013 Document Reviewed: 02/03/2013 [employer] Patient Information 2015 [employer]. This information is not intended to replace advice given to you by your health care provider. Make sure you discuss any questions you have with your health care provider.  

## 2014-05-12 LAB — OBSTETRIC PANEL
Antibody Screen: NEGATIVE
BASOS PCT: 0 % (ref 0–1)
Basophils Absolute: 0 10*3/uL (ref 0.0–0.1)
Eosinophils Absolute: 0.1 10*3/uL (ref 0.0–0.7)
Eosinophils Relative: 2 % (ref 0–5)
HCT: 41.9 % (ref 36.0–46.0)
HEP B S AG: NEGATIVE
Hemoglobin: 14 g/dL (ref 12.0–15.0)
LYMPHS ABS: 1.2 10*3/uL (ref 0.7–4.0)
Lymphocytes Relative: 18 % (ref 12–46)
MCH: 30.6 pg (ref 26.0–34.0)
MCHC: 33.4 g/dL (ref 30.0–36.0)
MCV: 91.5 fL (ref 78.0–100.0)
Monocytes Absolute: 0.6 10*3/uL (ref 0.1–1.0)
Monocytes Relative: 8 % (ref 3–12)
Neutro Abs: 5 10*3/uL (ref 1.7–7.7)
Neutrophils Relative %: 72 % (ref 43–77)
Platelets: 239 10*3/uL (ref 150–400)
RBC: 4.58 MIL/uL (ref 3.87–5.11)
RDW: 13.1 % (ref 11.5–15.5)
Rh Type: POSITIVE
Rubella: 2.08 Index — ABNORMAL HIGH (ref <0.90)
WBC: 6.9 10*3/uL (ref 4.0–10.5)

## 2014-05-12 LAB — GC/CHLAMYDIA PROBE AMP
CT Probe RNA: NEGATIVE
GC PROBE AMP APTIMA: NEGATIVE

## 2014-05-12 LAB — HIV ANTIBODY (ROUTINE TESTING W REFLEX): HIV 1&2 Ab, 4th Generation: NONREACTIVE

## 2014-05-13 LAB — CULTURE, OB URINE: Colony Count: 15000

## 2014-05-31 ENCOUNTER — Other Ambulatory Visit: Payer: Self-pay | Admitting: Obstetrics & Gynecology

## 2014-05-31 DIAGNOSIS — Z3682 Encounter for antenatal screening for nuchal translucency: Secondary | ICD-10-CM

## 2014-06-01 ENCOUNTER — Ambulatory Visit (INDEPENDENT_AMBULATORY_CARE_PROVIDER_SITE_OTHER): Payer: Managed Care, Other (non HMO) | Admitting: Obstetrics & Gynecology

## 2014-06-01 VITALS — BP 110/78 | HR 80 | Wt 156.0 lb

## 2014-06-01 DIAGNOSIS — O09899 Supervision of other high risk pregnancies, unspecified trimester: Secondary | ICD-10-CM

## 2014-06-01 DIAGNOSIS — Z3481 Encounter for supervision of other normal pregnancy, first trimester: Secondary | ICD-10-CM

## 2014-06-01 NOTE — Progress Notes (Signed)
Scheduled for first trimester screening on 06/09/14 Counseled about NIPS/Panorama: Interested in this modality.  Will draw this today. No other complaints or concerns.  Routine obstetric precautions reviewed.

## 2014-06-01 NOTE — Patient Instructions (Signed)
Return to clinic for any obstetric concerns or go to MAU for evaluation  

## 2014-06-09 ENCOUNTER — Ambulatory Visit (HOSPITAL_COMMUNITY): Admission: RE | Admit: 2014-06-09 | Payer: Managed Care, Other (non HMO) | Source: Ambulatory Visit

## 2014-06-09 ENCOUNTER — Ambulatory Visit (HOSPITAL_COMMUNITY)
Admission: RE | Admit: 2014-06-09 | Discharge: 2014-06-09 | Disposition: A | Discharge status: Home / Self Care | Payer: Managed Care, Other (non HMO) | Source: Ambulatory Visit | Attending: Obstetrics and Gynecology | Admitting: Obstetrics and Gynecology

## 2014-06-09 ENCOUNTER — Other Ambulatory Visit: Payer: Self-pay | Admitting: Obstetrics & Gynecology

## 2014-06-09 VITALS — BP 125/72 | HR 95 | Wt 157.2 lb

## 2014-06-09 DIAGNOSIS — Z3682 Encounter for antenatal screening for nuchal translucency: Secondary | ICD-10-CM

## 2014-06-09 DIAGNOSIS — Z3481 Encounter for supervision of other normal pregnancy, first trimester: Secondary | ICD-10-CM

## 2014-06-09 DIAGNOSIS — Z36 Encounter for antenatal screening of mother: Secondary | ICD-10-CM | POA: Insufficient documentation

## 2014-06-09 DIAGNOSIS — O09899 Supervision of other high risk pregnancies, unspecified trimester: Secondary | ICD-10-CM | POA: Insufficient documentation

## 2014-06-11 ENCOUNTER — Encounter: Payer: Self-pay | Admitting: Obstetrics & Gynecology

## 2014-06-27 ENCOUNTER — Encounter: Payer: Self-pay | Admitting: Obstetrics and Gynecology

## 2014-06-30 ENCOUNTER — Encounter: Payer: Self-pay | Admitting: Family Medicine

## 2014-06-30 ENCOUNTER — Ambulatory Visit (INDEPENDENT_AMBULATORY_CARE_PROVIDER_SITE_OTHER): Payer: Managed Care, Other (non HMO) | Admitting: Family Medicine

## 2014-06-30 VITALS — BP 123/84 | HR 90 | Wt 157.0 lb

## 2014-06-30 DIAGNOSIS — Z3481 Encounter for supervision of other normal pregnancy, first trimester: Secondary | ICD-10-CM

## 2014-06-30 NOTE — Patient Instructions (Signed)
Breastfeeding Deciding to breastfeed is one of the best choices you can make for you and your baby. A change in hormones during pregnancy causes your breast tissue to grow and increases the number and size of your milk ducts. These hormones also allow proteins, sugars, and fats from your blood supply to make breast milk in your milk-producing glands. Hormones prevent breast milk from being released before your baby is born as well as prompt milk flow after birth. Once breastfeeding has begun, thoughts of your baby, as well as his or her sucking or crying, can stimulate the release of milk from your milk-producing glands.  BENEFITS OF BREASTFEEDING For Your Baby  Your first milk (colostrum) helps your baby's digestive system function better.   There are antibodies in your milk that help your baby fight off infections.   Your baby has a lower incidence of asthma, allergies, and sudden infant death syndrome.   The nutrients in breast milk are better for your baby than infant formulas and are designed uniquely for your baby's needs.   Breast milk improves your baby's brain development.   Your baby is less likely to develop other conditions, such as childhood obesity, asthma, or type 2 diabetes mellitus.  For You   Breastfeeding helps to create a very special bond between you and your baby.   Breastfeeding is convenient. Breast milk is always available at the correct temperature and costs nothing.   Breastfeeding helps to burn calories and helps you lose the weight gained during pregnancy.   Breastfeeding makes your uterus contract to its prepregnancy size faster and slows bleeding (lochia) after you give birth.   Breastfeeding helps to lower your risk of developing type 2 diabetes mellitus, osteoporosis, and breast or ovarian cancer later in life. SIGNS THAT YOUR BABY IS HUNGRY Early Signs of Hunger  Increased alertness or activity.  Stretching.  Movement of the head from  side to side.  Movement of the head and opening of the mouth when the corner of the mouth or cheek is stroked (rooting).  Increased sucking sounds, smacking lips, cooing, sighing, or squeaking.  Hand-to-mouth movements.  Increased sucking of fingers or hands. Late Signs of Hunger  Fussing.  Intermittent crying. Extreme Signs of Hunger Signs of extreme hunger will require calming and consoling before your baby will be able to breastfeed successfully. Do not wait for the following signs of extreme hunger to occur before you initiate breastfeeding:   Restlessness.  A loud, strong cry.   Screaming. BREASTFEEDING BASICS Breastfeeding Initiation  Find a comfortable place to sit or lie down, with your neck and back well supported.  Place a pillow or rolled up blanket under your baby to bring him or her to the level of your breast (if you are seated). Nursing pillows are specially designed to help support your arms and your baby while you breastfeed.  Make sure that your baby's abdomen is facing your abdomen.   Gently massage your breast. With your fingertips, massage from your chest wall toward your nipple in a circular motion. This encourages milk flow. You may need to continue this action during the feeding if your milk flows slowly.  Support your breast with 4 fingers underneath and your thumb above your nipple. Make sure your fingers are well away from your nipple and your baby's mouth.   Stroke your baby's lips gently with your finger or nipple.   When your baby's mouth is open wide enough, quickly bring your baby to your   breast, placing your entire nipple and as much of the colored area around your nipple (areola) as possible into your baby's mouth.   More areola should be visible above your baby's upper lip than below the lower lip.   Your baby's tongue should be between his or her lower gum and your breast.   Ensure that your baby's mouth is correctly positioned  around your nipple (latched). Your baby's lips should create a seal on your breast and be turned out (everted).  It is common for your baby to suck about 2-3 minutes in order to start the flow of breast milk. Latching Teaching your baby how to latch on to your breast properly is very important. An improper latch can cause nipple pain and decreased milk supply for you and poor weight gain in your baby. Also, if your baby is not latched onto your nipple properly, he or she may swallow some air during feeding. This can make your baby fussy. Burping your baby when you switch breasts during the feeding can help to get rid of the air. However, teaching your baby to latch on properly is still the best way to prevent fussiness from swallowing air while breastfeeding. Signs that your baby has successfully latched on to your nipple:    Silent tugging or silent sucking, without causing you pain.   Swallowing heard between every 3-4 sucks.    Muscle movement above and in front of his or her ears while sucking.  Signs that your baby has not successfully latched on to nipple:   Sucking sounds or smacking sounds from your baby while breastfeeding.  Nipple pain. If you think your baby has not latched on correctly, slip your finger into the corner of your baby's mouth to break the suction and place it between your baby's gums. Attempt breastfeeding initiation again. Signs of Successful Breastfeeding Signs from your baby:   A gradual decrease in the number of sucks or complete cessation of sucking.   Falling asleep.   Relaxation of his or her body.   Retention of a small amount of milk in his or her mouth.   Letting go of your breast by himself or herself. Signs from you:  Breasts that have increased in firmness, weight, and size 1-3 hours after feeding.   Breasts that are softer immediately after breastfeeding.  Increased milk volume, as well as a change in milk consistency and color by  the fifth day of breastfeeding.   Nipples that are not sore, cracked, or bleeding. Signs That Your Baby is Getting Enough Milk  Wetting at least 3 diapers in a 24-hour period. The urine should be clear and pale yellow by age 5 days.  At least 3 stools in a 24-hour period by age 5 days. The stool should be soft and yellow.  At least 3 stools in a 24-hour period by age 7 days. The stool should be seedy and yellow.  No loss of weight greater than 10% of birth weight during the first 3 days of age.  Average weight gain of 4-7 ounces (113-198 g) per week after age 4 days.  Consistent daily weight gain by age 5 days, without weight loss after the age of 2 weeks. After a feeding, your baby may spit up a small amount. This is common. BREASTFEEDING FREQUENCY AND DURATION Frequent feeding will help you make more milk and can prevent sore nipples and breast engorgement. Breastfeed when you feel the need to reduce the fullness of your breasts   or when your baby shows signs of hunger. This is called "breastfeeding on demand." Avoid introducing a pacifier to your baby while you are working to establish breastfeeding (the first 4-6 weeks after your baby is born). After this time you may choose to use a pacifier. Research has shown that pacifier use during the first year of a baby's life decreases the risk of sudden infant death syndrome (SIDS). Allow your baby to feed on each breast as long as he or she wants. Breastfeed until your baby is finished feeding. When your baby unlatches or falls asleep while feeding from the first breast, offer the second breast. Because newborns are often sleepy in the first few weeks of life, you may need to awaken your baby to get him or her to feed. Breastfeeding times will vary from baby to baby. However, the following rules can serve as a guide to help you ensure that your baby is properly fed:  Newborns (babies 4 weeks of age or younger) may breastfeed every 1-3  hours.  Newborns should not go longer than 3 hours during the day or 5 hours during the night without breastfeeding.  You should breastfeed your baby a minimum of 8 times in a 24-hour period until you begin to introduce solid foods to your baby at around 6 months of age. BREAST MILK PUMPING Pumping and storing breast milk allows you to ensure that your baby is exclusively fed your breast milk, even at times when you are unable to breastfeed. This is especially important if you are going back to work while you are still breastfeeding or when you are not able to be present during feedings. Your lactation consultant can give you guidelines on how long it is safe to store breast milk.  A breast pump is a machine that allows you to pump milk from your breast into a sterile bottle. The pumped breast milk can then be stored in a refrigerator or freezer. Some breast pumps are operated by hand, while others use electricity. Ask your lactation consultant which type will work best for you. Breast pumps can be purchased, but some hospitals and breastfeeding support groups lease breast pumps on a monthly basis. A lactation consultant can teach you how to hand express breast milk, if you prefer not to use a pump.  CARING FOR YOUR BREASTS WHILE YOU BREASTFEED Nipples can become dry, cracked, and sore while breastfeeding. The following recommendations can help keep your breasts moisturized and healthy:  Avoid using soap on your nipples.   Wear a supportive bra. Although not required, special nursing bras and tank tops are designed to allow access to your breasts for breastfeeding without taking off your entire bra or top. Avoid wearing underwire-style bras or extremely tight bras.  Air dry your nipples for 3-4minutes after each feeding.   Use only cotton bra pads to absorb leaked breast milk. Leaking of breast milk between feedings is normal.   Use lanolin on your nipples after breastfeeding. Lanolin helps to  maintain your skin's normal moisture barrier. If you use pure lanolin, you do not need to wash it off before feeding your baby again. Pure lanolin is not toxic to your baby. You may also hand express a few drops of breast milk and gently massage that milk into your nipples and allow the milk to air dry. In the first few weeks after giving birth, some women experience extremely full breasts (engorgement). Engorgement can make your breasts feel heavy, warm, and tender to the   touch. Engorgement peaks within 3-5 days after you give birth. The following recommendations can help ease engorgement:  Completely empty your breasts while breastfeeding or pumping. You may want to start by applying warm, moist heat (in the shower or with warm water-soaked hand towels) just before feeding or pumping. This increases circulation and helps the milk flow. If your baby does not completely empty your breasts while breastfeeding, pump any extra milk after he or she is finished.  Wear a snug bra (nursing or regular) or tank top for 1-2 days to signal your body to slightly decrease milk production.  Apply ice packs to your breasts, unless this is too uncomfortable for you.  Make sure that your baby is latched on and positioned properly while breastfeeding. If engorgement persists after 48 hours of following these recommendations, contact your health care provider or a lactation consultant. OVERALL HEALTH CARE RECOMMENDATIONS WHILE BREASTFEEDING  Eat healthy foods. Alternate between meals and snacks, eating 3 of each per day. Because what you eat affects your breast milk, some of the foods may make your baby more irritable than usual. Avoid eating these foods if you are sure that they are negatively affecting your baby.  Drink milk, fruit juice, and water to satisfy your thirst (about 10 glasses a day).   Rest often, relax, and continue to take your prenatal vitamins to prevent fatigue, stress, and anemia.  Continue  breast self-awareness checks.  Avoid chewing and smoking tobacco.  Avoid alcohol and drug use. Some medicines that may be harmful to your baby can pass through breast milk. It is important to ask your health care provider before taking any medicine, including all over-the-counter and prescription medicine as well as vitamin and herbal supplements. It is possible to become pregnant while breastfeeding. If birth control is desired, ask your health care provider about options that will be safe for your baby. SEEK MEDICAL CARE IF:   You feel like you want to stop breastfeeding or have become frustrated with breastfeeding.  You have painful breasts or nipples.  Your nipples are cracked or bleeding.  Your breasts are red, tender, or warm.  You have a swollen area on either breast.  You have a fever or chills.  You have nausea or vomiting.  You have drainage other than breast milk from your nipples.  Your breasts do not become full before feedings by the fifth day after you give birth.  You feel sad and depressed.  Your baby is too sleepy to eat well.  Your baby is having trouble sleeping.   Your baby is wetting less than 3 diapers in a 24-hour period.  Your baby has less than 3 stools in a 24-hour period.  Your baby's skin or the white part of his or her eyes becomes yellow.   Your baby is not gaining weight by 5 days of age. SEEK IMMEDIATE MEDICAL CARE IF:   Your baby is overly tired (lethargic) and does not want to wake up and feed.  Your baby develops an unexplained fever. Document Released: 08/12/2005 Document Revised: 08/17/2013 Document Reviewed: 02/03/2013 [employer] Patient Information 2015 [employer]. This information is not intended to replace advice given to you by your health care provider. Make sure you discuss any questions you have with your health care provider.  

## 2014-06-30 NOTE — Progress Notes (Signed)
Reports normal panorama--boy Schedule anatomy

## 2014-07-01 ENCOUNTER — Other Ambulatory Visit (HOSPITAL_COMMUNITY): Payer: Self-pay

## 2014-07-28 ENCOUNTER — Ambulatory Visit (INDEPENDENT_AMBULATORY_CARE_PROVIDER_SITE_OTHER): Payer: Managed Care, Other (non HMO) | Admitting: Family Medicine

## 2014-07-28 VITALS — BP 121/85 | HR 91 | Wt 160.0 lb

## 2014-07-28 DIAGNOSIS — Z3481 Encounter for supervision of other normal pregnancy, first trimester: Secondary | ICD-10-CM

## 2014-07-28 NOTE — Progress Notes (Signed)
Doing well-- Anatomy u/s next week.

## 2014-07-28 NOTE — Patient Instructions (Addendum)
Second Trimester of Pregnancy The second trimester is from week 13 through week 28, months 4 through 6. The second trimester is often a time when you feel your best. Your body has also adjusted to being pregnant, and you begin to feel better physically. Usually, morning sickness has lessened or quit completely, you may have more energy, and you may have an increase in appetite. The second trimester is also a time when the fetus is growing rapidly. At the end of the sixth month, the fetus is about 9 inches long and weighs about 1 pounds. You will likely begin to feel the baby move (quickening) between 18 and 20 weeks of the pregnancy. BODY CHANGES Your body goes through many changes during pregnancy. The changes vary from woman to woman.   Your weight will continue to increase. You will notice your lower abdomen bulging out.  You may begin to get stretch marks on your hips, abdomen, and breasts.  You may develop headaches that can be relieved by medicines approved by your health care provider.  You may urinate more often because the fetus is pressing on your bladder.  You may develop or continue to have heartburn as a result of your pregnancy.  You may develop constipation because certain hormones are causing the muscles that push waste through your intestines to slow down.  You may develop hemorrhoids or swollen, bulging veins (varicose veins).  You may have back pain because of the weight gain and pregnancy hormones relaxing your joints between the bones in your pelvis and as a result of a shift in weight and the muscles that support your balance.  Your breasts will continue to grow and be tender.  Your gums may bleed and may be sensitive to brushing and flossing.  Dark spots or blotches (chloasma, mask of pregnancy) may develop on your face. This will likely fade after the baby is born.  A dark line from your belly button to the pubic area (linea nigra) may appear. This will likely  fade after the baby is born.  You may have changes in your hair. These can include thickening of your hair, rapid growth, and changes in texture. Some women also have hair loss during or after pregnancy, or hair that feels dry or thin. Your hair will most likely return to normal after your baby is born. WHAT TO EXPECT AT YOUR PRENATAL VISITS During a routine prenatal visit:  You will be weighed to make sure you and the fetus are growing normally.  Your blood pressure will be taken.  Your abdomen will be measured to track your baby's growth.  The fetal heartbeat will be listened to.  Any test results from the previous visit will be discussed. Your health care provider may ask you:  How you are feeling.  If you are feeling the baby move.  If you have had any abnormal symptoms, such as leaking fluid, bleeding, severe headaches, or abdominal cramping.  If you have any questions. Other tests that may be performed during your second trimester include:  Blood tests that check for:  Low iron levels (anemia).  Gestational diabetes (between 24 and 28 weeks).  Rh antibodies.  Urine tests to check for infections, diabetes, or protein in the urine.  An ultrasound to confirm the proper growth and development of the baby.  An amniocentesis to check for possible genetic problems.  Fetal screens for spina bifida and Down syndrome. HOME CARE INSTRUCTIONS   Avoid all smoking, herbs, alcohol, and unprescribed   drugs. These chemicals affect the formation and growth of the baby.  Follow your health care provider's instructions regarding medicine use. There are medicines that are either safe or unsafe to take during pregnancy.  Exercise only as directed by your health care provider. Experiencing uterine cramps is a good sign to stop exercising.  Continue to eat regular, healthy meals.  Wear a good support bra for breast tenderness.  Do not use hot tubs, steam rooms, or saunas.  Wear  your seat belt at all times when driving.  Avoid raw meat, uncooked cheese, cat litter boxes, and soil used by cats. These carry germs that can cause birth defects in the baby.  Take your prenatal vitamins.  Try taking a stool softener (if your health care provider approves) if you develop constipation. Eat more high-fiber foods, such as fresh vegetables or fruit and whole grains. Drink plenty of fluids to keep your urine clear or pale yellow.  Take warm sitz baths to soothe any pain or discomfort caused by hemorrhoids. Use hemorrhoid cream if your health care provider approves.  If you develop varicose veins, wear support hose. Elevate your feet for 15 minutes, 3-4 times a day. Limit salt in your diet.  Avoid heavy lifting, wear low heel shoes, and practice good posture.  Rest with your legs elevated if you have leg cramps or low back pain.  Visit your dentist if you have not gone yet during your pregnancy. Use a soft toothbrush to brush your teeth and be gentle when you floss.  A sexual relationship may be continued unless your health care provider directs you otherwise.  Continue to go to all your prenatal visits as directed by your health care provider. SEEK MEDICAL CARE IF:   You have dizziness.  You have mild pelvic cramps, pelvic pressure, or nagging pain in the abdominal area.  You have persistent nausea, vomiting, or diarrhea.  You have a bad smelling vaginal discharge.  You have pain with urination. SEEK IMMEDIATE MEDICAL CARE IF:   You have a fever.  You are leaking fluid from your vagina.  You have spotting or bleeding from your vagina.  You have severe abdominal cramping or pain.  You have rapid weight gain or loss.  You have shortness of breath with chest pain.  You notice sudden or extreme swelling of your face, hands, ankles, feet, or legs.  You have not felt your baby move in over an hour.  You have severe headaches that do not go away with  medicine.  You have vision changes. Document Released: 08/06/2001 Document Revised: 08/17/2013 Document Reviewed: 10/13/2012 [employer] Patient Information 2015 [employer]. This information is not intended to replace advice given to you by your health care provider. Make sure you discuss any questions you have with your health care provider.  Breastfeeding Deciding to breastfeed is one of the best choices you can make for you and your baby. A change in hormones during pregnancy causes your breast tissue to grow and increases the number and size of your milk ducts. These hormones also allow proteins, sugars, and fats from your blood supply to make breast milk in your milk-producing glands. Hormones prevent breast milk from being released before your baby is born as well as prompt milk flow after birth. Once breastfeeding has begun, thoughts of your baby, as well as his or her sucking or crying, can stimulate the release of milk from your milk-producing glands.  BENEFITS OF BREASTFEEDING For Your Baby  Your first   milk (colostrum) helps your baby's digestive system function better.   There are antibodies in your milk that help your baby fight off infections.   Your baby has a lower incidence of asthma, allergies, and sudden infant death syndrome.   The nutrients in breast milk are better for your baby than infant formulas and are designed uniquely for your baby's needs.   Breast milk improves your baby's brain development.   Your baby is less likely to develop other conditions, such as childhood obesity, asthma, or type 2 diabetes mellitus.  For You   Breastfeeding helps to create a very special bond between you and your baby.   Breastfeeding is convenient. Breast milk is always available at the correct temperature and costs nothing.   Breastfeeding helps to burn calories and helps you lose the weight gained during pregnancy.   Breastfeeding makes your uterus contract to its  prepregnancy size faster and slows bleeding (lochia) after you give birth.   Breastfeeding helps to lower your risk of developing type 2 diabetes mellitus, osteoporosis, and breast or ovarian cancer later in life. SIGNS THAT YOUR BABY IS HUNGRY Early Signs of Hunger  Increased alertness or activity.  Stretching.  Movement of the head from side to side.  Movement of the head and opening of the mouth when the corner of the mouth or cheek is stroked (rooting).  Increased sucking sounds, smacking lips, cooing, sighing, or squeaking.  Hand-to-mouth movements.  Increased sucking of fingers or hands. Late Signs of Hunger  Fussing.  Intermittent crying. Extreme Signs of Hunger Signs of extreme hunger will require calming and consoling before your baby will be able to breastfeed successfully. Do not wait for the following signs of extreme hunger to occur before you initiate breastfeeding:   Restlessness.  A loud, strong cry.   Screaming. BREASTFEEDING BASICS Breastfeeding Initiation  Find a comfortable place to sit or lie down, with your neck and back well supported.  Place a pillow or rolled up blanket under your baby to bring him or her to the level of your breast (if you are seated). Nursing pillows are specially designed to help support your arms and your baby while you breastfeed.  Make sure that your baby's abdomen is facing your abdomen.   Gently massage your breast. With your fingertips, massage from your chest wall toward your nipple in a circular motion. This encourages milk flow. You may need to continue this action during the feeding if your milk flows slowly.  Support your breast with 4 fingers underneath and your thumb above your nipple. Make sure your fingers are well away from your nipple and your baby's mouth.   Stroke your baby's lips gently with your finger or nipple.   When your baby's mouth is open wide enough, quickly bring your baby to your breast,  placing your entire nipple and as much of the colored area around your nipple (areola) as possible into your baby's mouth.   More areola should be visible above your baby's upper lip than below the lower lip.   Your baby's tongue should be between his or her lower gum and your breast.   Ensure that your baby's mouth is correctly positioned around your nipple (latched). Your baby's lips should create a seal on your breast and be turned out (everted).  It is common for your baby to suck about 2-3 minutes in order to start the flow of breast milk. Latching Teaching your baby how to latch on to your breast   properly is very important. An improper latch can cause nipple pain and decreased milk supply for you and poor weight gain in your baby. Also, if your baby is not latched onto your nipple properly, he or she may swallow some air during feeding. This can make your baby fussy. Burping your baby when you switch breasts during the feeding can help to get rid of the air. However, teaching your baby to latch on properly is still the best way to prevent fussiness from swallowing air while breastfeeding. Signs that your baby has successfully latched on to your nipple:    Silent tugging or silent sucking, without causing you pain.   Swallowing heard between every 3-4 sucks.    Muscle movement above and in front of his or her ears while sucking.  Signs that your baby has not successfully latched on to nipple:   Sucking sounds or smacking sounds from your baby while breastfeeding.  Nipple pain. If you think your baby has not latched on correctly, slip your finger into the corner of your baby's mouth to break the suction and place it between your baby's gums. Attempt breastfeeding initiation again. Signs of Successful Breastfeeding Signs from your baby:   A gradual decrease in the number of sucks or complete cessation of sucking.   Falling asleep.   Relaxation of his or her body.    Retention of a small amount of milk in his or her mouth.   Letting go of your breast by himself or herself. Signs from you:  Breasts that have increased in firmness, weight, and size 1-3 hours after feeding.   Breasts that are softer immediately after breastfeeding.  Increased milk volume, as well as a change in milk consistency and color by the fifth day of breastfeeding.   Nipples that are not sore, cracked, or bleeding. Signs That Your Baby is Getting Enough Milk  Wetting at least 3 diapers in a 24-hour period. The urine should be clear and pale yellow by age 5 days.  At least 3 stools in a 24-hour period by age 5 days. The stool should be soft and yellow.  At least 3 stools in a 24-hour period by age 7 days. The stool should be seedy and yellow.  No loss of weight greater than 10% of birth weight during the first 3 days of age.  Average weight gain of 4-7 ounces (113-198 g) per week after age 4 days.  Consistent daily weight gain by age 5 days, without weight loss after the age of 2 weeks. After a feeding, your baby may spit up a small amount. This is common. BREASTFEEDING FREQUENCY AND DURATION Frequent feeding will help you make more milk and can prevent sore nipples and breast engorgement. Breastfeed when you feel the need to reduce the fullness of your breasts or when your baby shows signs of hunger. This is called "breastfeeding on demand." Avoid introducing a pacifier to your baby while you are working to establish breastfeeding (the first 4-6 weeks after your baby is born). After this time you may choose to use a pacifier. Research has shown that pacifier use during the first year of a baby's life decreases the risk of sudden infant death syndrome (SIDS). Allow your baby to feed on each breast as long as he or she wants. Breastfeed until your baby is finished feeding. When your baby unlatches or falls asleep while feeding from the first breast, offer the second breast.  Because newborns are often sleepy in the   first few weeks of life, you may need to awaken your baby to get him or her to feed. Breastfeeding times will vary from baby to baby. However, the following rules can serve as a guide to help you ensure that your baby is properly fed:  Newborns (babies 4 weeks of age or younger) may breastfeed every 1-3 hours.  Newborns should not go longer than 3 hours during the day or 5 hours during the night without breastfeeding.  You should breastfeed your baby a minimum of 8 times in a 24-hour period until you begin to introduce solid foods to your baby at around 6 months of age. BREAST MILK PUMPING Pumping and storing breast milk allows you to ensure that your baby is exclusively fed your breast milk, even at times when you are unable to breastfeed. This is especially important if you are going back to work while you are still breastfeeding or when you are not able to be present during feedings. Your lactation consultant can give you guidelines on how long it is safe to store breast milk.  A breast pump is a machine that allows you to pump milk from your breast into a sterile bottle. The pumped breast milk can then be stored in a refrigerator or freezer. Some breast pumps are operated by hand, while others use electricity. Ask your lactation consultant which type will work best for you. Breast pumps can be purchased, but some hospitals and breastfeeding support groups lease breast pumps on a monthly basis. A lactation consultant can teach you how to hand express breast milk, if you prefer not to use a pump.  CARING FOR YOUR BREASTS WHILE YOU BREASTFEED Nipples can become dry, cracked, and sore while breastfeeding. The following recommendations can help keep your breasts moisturized and healthy:  Avoid using soap on your nipples.   Wear a supportive bra. Although not required, special nursing bras and tank tops are designed to allow access to your breasts for  breastfeeding without taking off your entire bra or top. Avoid wearing underwire-style bras or extremely tight bras.  Air dry your nipples for 3-4minutes after each feeding.   Use only cotton bra pads to absorb leaked breast milk. Leaking of breast milk between feedings is normal.   Use lanolin on your nipples after breastfeeding. Lanolin helps to maintain your skin's normal moisture barrier. If you use pure lanolin, you do not need to wash it off before feeding your baby again. Pure lanolin is not toxic to your baby. You may also hand express a few drops of breast milk and gently massage that milk into your nipples and allow the milk to air dry. In the first few weeks after giving birth, some women experience extremely full breasts (engorgement). Engorgement can make your breasts feel heavy, warm, and tender to the touch. Engorgement peaks within 3-5 days after you give birth. The following recommendations can help ease engorgement:  Completely empty your breasts while breastfeeding or pumping. You may want to start by applying warm, moist heat (in the shower or with warm water-soaked hand towels) just before feeding or pumping. This increases circulation and helps the milk flow. If your baby does not completely empty your breasts while breastfeeding, pump any extra milk after he or she is finished.  Wear a snug bra (nursing or regular) or tank top for 1-2 days to signal your body to slightly decrease milk production.  Apply ice packs to your breasts, unless this is too uncomfortable for you.    Make sure that your baby is latched on and positioned properly while breastfeeding. If engorgement persists after 48 hours of following these recommendations, contact your health care provider or a Advertising copywriter. OVERALL HEALTH CARE RECOMMENDATIONS WHILE BREASTFEEDING  Eat healthy foods. Alternate between meals and snacks, eating 3 of each per day. Because what you eat affects your breast milk,  some of the foods may make your baby more irritable than usual. Avoid eating these foods if you are sure that they are negatively affecting your baby.  Drink milk, fruit juice, and water to satisfy your thirst (about 10 glasses a day).   Rest often, relax, and continue to take your prenatal vitamins to prevent fatigue, stress, and anemia.  Continue breast self-awareness checks.  Avoid chewing and smoking tobacco.  Avoid alcohol and drug use. Some medicines that may be harmful to your baby can pass through breast milk. It is important to ask your health care provider before taking any medicine, including all over-the-counter and prescription medicine as well as vitamin and herbal supplements. It is possible to become pregnant while breastfeeding. If birth control is desired, ask your health care provider about options that will be safe for your baby. SEEK MEDICAL CARE IF:   You feel like you want to stop breastfeeding or have become frustrated with breastfeeding.  You have painful breasts or nipples.  Your nipples are cracked or bleeding.  Your breasts are red, tender, or warm.  You have a swollen area on either breast.  You have a fever or chills.  You have nausea or vomiting.  You have drainage other than breast milk from your nipples.  Your breasts do not become full before feedings by the fifth day after you give birth.  You feel sad and depressed.  Your baby is too sleepy to eat well.  Your baby is having trouble sleeping.   Your baby is wetting less than 3 diapers in a 24-hour period.  Your baby has less than 3 stools in a 24-hour period.  Your baby's skin or the white part of his or her eyes becomes yellow.   Your baby is not gaining weight by 39 days of age. SEEK IMMEDIATE MEDICAL CARE IF:   Your baby is overly tired (lethargic) and does not want to wake up and feed.  Your baby develops an unexplained fever. Document Released: 08/12/2005 Document Revised:  08/17/2013 Document Reviewed: 02/03/2013 [facility] Patient Information 2015 [place], [place]. This information is not intended to replace advice given to you by your health care provider. Make sure you discuss any questions you have with your health care provider.  Breastfeeding Deciding to breastfeed is one of the best choices you can make for you and your baby. A change in hormones during pregnancy causes your breast tissue to grow and increases the number and size of your milk ducts. These hormones also allow proteins, sugars, and fats from your blood supply to make breast milk in your milk-producing glands. Hormones prevent breast milk from being released before your baby is born as well as prompt milk flow after birth. Once breastfeeding has begun, thoughts of your baby, as well as his or her sucking or crying, can stimulate the release of milk from your milk-producing glands.  BENEFITS OF BREASTFEEDING For Your Baby  Your first milk (colostrum) helps your baby's digestive system function better.   There are antibodies in your milk that help your baby fight off infections.   Your baby has a lower incidence of  asthma, allergies, and sudden infant death syndrome.   The nutrients in breast milk are better for your baby than infant formulas and are designed uniquely for your baby's needs.   Breast milk improves your baby's brain development.   Your baby is less likely to develop other conditions, such as childhood obesity, asthma, or type 2 diabetes mellitus.  For You   Breastfeeding helps to create a very special bond between you and your baby.   Breastfeeding is convenient. Breast milk is always available at the correct temperature and costs nothing.   Breastfeeding helps to burn calories and helps you lose the weight gained during pregnancy.   Breastfeeding makes your uterus contract to its prepregnancy size faster and slows bleeding (lochia) after you give birth.    Breastfeeding helps to lower your risk of developing type 2 diabetes mellitus, osteoporosis, and breast or ovarian cancer later in life. SIGNS THAT YOUR BABY IS HUNGRY Early Signs of Hunger  Increased alertness or activity.  Stretching.  Movement of the head from side to side.  Movement of the head and opening of the mouth when the corner of the mouth or cheek is stroked (rooting).  Increased sucking sounds, smacking lips, cooing, sighing, or squeaking.  Hand-to-mouth movements.  Increased sucking of fingers or hands. Late Signs of Hunger  Fussing.  Intermittent crying. Extreme Signs of Hunger Signs of extreme hunger will require calming and consoling before your baby will be able to breastfeed successfully. Do not wait for the following signs of extreme hunger to occur before you initiate breastfeeding:   Restlessness.  A loud, strong cry.   Screaming. BREASTFEEDING BASICS Breastfeeding Initiation  Find a comfortable place to sit or lie down, with your neck and back well supported.  Place a pillow or rolled up blanket under your baby to bring him or her to the level of your breast (if you are seated). Nursing pillows are specially designed to help support your arms and your baby while you breastfeed.  Make sure that your baby's abdomen is facing your abdomen.   Gently massage your breast. With your fingertips, massage from your chest wall toward your nipple in a circular motion. This encourages milk flow. You may need to continue this action during the feeding if your milk flows slowly.  Support your breast with 4 fingers underneath and your thumb above your nipple. Make sure your fingers are well away from your nipple and your baby's mouth.   Stroke your baby's lips gently with your finger or nipple.   When your baby's mouth is open wide enough, quickly bring your baby to your breast, placing your entire nipple and as much of the colored area around your  nipple (areola) as possible into your baby's mouth.   More areola should be visible above your baby's upper lip than below the lower lip.   Your baby's tongue should be between his or her lower gum and your breast.   Ensure that your baby's mouth is correctly positioned around your nipple (latched). Your baby's lips should create a seal on your breast and be turned out (everted).  It is common for your baby to suck about 2-3 minutes in order to start the flow of breast milk. Latching Teaching your baby how to latch on to your breast properly is very important. An improper latch can cause nipple pain and decreased milk supply for you and poor weight gain in your baby. Also, if your baby is not latched onto your  nipple properly, he or she may swallow some air during feeding. This can make your baby fussy. Burping your baby when you switch breasts during the feeding can help to get rid of the air. However, teaching your baby to latch on properly is still the best way to prevent fussiness from swallowing air while breastfeeding. Signs that your baby has successfully latched on to your nipple:    Silent tugging or silent sucking, without causing you pain.   Swallowing heard between every 3-4 sucks.    Muscle movement above and in front of his or her ears while sucking.  Signs that your baby has not successfully latched on to nipple:   Sucking sounds or smacking sounds from your baby while breastfeeding.  Nipple pain. If you think your baby has not latched on correctly, slip your finger into the corner of your baby's mouth to break the suction and place it between your baby's gums. Attempt breastfeeding initiation again. Signs of Successful Breastfeeding Signs from your baby:   A gradual decrease in the number of sucks or complete cessation of sucking.   Falling asleep.   Relaxation of his or her body.   Retention of a small amount of milk in his or her mouth.   Letting go of  your breast by himself or herself. Signs from you:  Breasts that have increased in firmness, weight, and size 1-3 hours after feeding.   Breasts that are softer immediately after breastfeeding.  Increased milk volume, as well as a change in milk consistency and color by the fifth day of breastfeeding.   Nipples that are not sore, cracked, or bleeding. Signs That Your Pecola LeisureBaby is Getting Enough Milk  Wetting at least 3 diapers in a 24-hour period. The urine should be clear and pale yellow by age 69 days.  At least 3 stools in a 24-hour period by age 69 days. The stool should be soft and yellow.  At least 3 stools in a 24-hour period by age 60 days. The stool should be seedy and yellow.  No loss of weight greater than 10% of birth weight during the first 663 days of age.  Average weight gain of 4-7 ounces (113-198 g) per week after age 14 days.  Consistent daily weight gain by age 69 days, without weight loss after the age of 2 weeks. After a feeding, your baby may spit up a small amount. This is common. BREASTFEEDING FREQUENCY AND DURATION Frequent feeding will help you make more milk and can prevent sore nipples and breast engorgement. Breastfeed when you feel the need to reduce the fullness of your breasts or when your baby shows signs of hunger. This is called "breastfeeding on demand." Avoid introducing a pacifier to your baby while you are working to establish breastfeeding (the first 4-6 weeks after your baby is born). After this time you may choose to use a pacifier. Research has shown that pacifier use during the first year of a baby's life decreases the risk of sudden infant death syndrome (SIDS). Allow your baby to feed on each breast as long as he or she wants. Breastfeed until your baby is finished feeding. When your baby unlatches or falls asleep while feeding from the first breast, offer the second breast. Because newborns are often sleepy in the first few weeks of life, you may need to  awaken your baby to get him or her to feed. Breastfeeding times will vary from baby to baby. However, the following rules can serve  as a guide to help you ensure that your baby is properly fed:  Newborns (babies 30 weeks of age or younger) may breastfeed every 1-3 hours.  Newborns should not go longer than 3 hours during the day or 5 hours during the night without breastfeeding.  You should breastfeed your baby a minimum of 8 times in a 24-hour period until you begin to introduce solid foods to your baby at around 81 months of age. BREAST MILK PUMPING Pumping and storing breast milk allows you to ensure that your baby is exclusively fed your breast milk, even at times when you are unable to breastfeed. This is especially important if you are going back to work while you are still breastfeeding or when you are not able to be present during feedings. Your lactation consultant can give you guidelines on how long it is safe to store breast milk.  A breast pump is a machine that allows you to pump milk from your breast into a sterile bottle. The pumped breast milk can then be stored in a refrigerator or freezer. Some breast pumps are operated by hand, while others use electricity. Ask your lactation consultant which type will work best for you. Breast pumps can be purchased, but some hospitals and breastfeeding support groups lease breast pumps on a monthly basis. A lactation consultant can teach you how to hand express breast milk, if you prefer not to use a pump.  CARING FOR YOUR BREASTS WHILE YOU BREASTFEED Nipples can become dry, cracked, and sore while breastfeeding. The following recommendations can help keep your breasts moisturized and healthy:  Avoid using soap on your nipples.   Wear a supportive bra. Although not required, special nursing bras and tank tops are designed to allow access to your breasts for breastfeeding without taking off your entire bra or top. Avoid wearing underwire-style bras  or extremely tight bras.  Air dry your nipples for 3-60minutes after each feeding.   Use only cotton bra pads to absorb leaked breast milk. Leaking of breast milk between feedings is normal.   Use lanolin on your nipples after breastfeeding. Lanolin helps to maintain your skin's normal moisture barrier. If you use pure lanolin, you do not need to wash it off before feeding your baby again. Pure lanolin is not toxic to your baby. You may also hand express a few drops of breast milk and gently massage that milk into your nipples and allow the milk to air dry. In the first few weeks after giving birth, some women experience extremely full breasts (engorgement). Engorgement can make your breasts feel heavy, warm, and tender to the touch. Engorgement peaks within 3-5 days after you give birth. The following recommendations can help ease engorgement:  Completely empty your breasts while breastfeeding or pumping. You may want to start by applying warm, moist heat (in the shower or with warm water-soaked hand towels) just before feeding or pumping. This increases circulation and helps the milk flow. If your baby does not completely empty your breasts while breastfeeding, pump any extra milk after he or she is finished.  Wear a snug bra (nursing or regular) or tank top for 1-2 days to signal your body to slightly decrease milk production.  Apply ice packs to your breasts, unless this is too uncomfortable for you.  Make sure that your baby is latched on and positioned properly while breastfeeding. If engorgement persists after 48 hours of following these recommendations, contact your health care provider or a Advertising copywriter. OVERALL  HEALTH CARE RECOMMENDATIONS WHILE BREASTFEEDING  Eat healthy foods. Alternate between meals and snacks, eating 3 of each per day. Because what you eat affects your breast milk, some of the foods may make your baby more irritable than usual. Avoid eating these foods if  you are sure that they are negatively affecting your baby.  Drink milk, fruit juice, and water to satisfy your thirst (about 10 glasses a day).   Rest often, relax, and continue to take your prenatal vitamins to prevent fatigue, stress, and anemia.  Continue breast self-awareness checks.  Avoid chewing and smoking tobacco.  Avoid alcohol and drug use. Some medicines that may be harmful to your baby can pass through breast milk. It is important to ask your health care provider before taking any medicine, including all over-the-counter and prescription medicine as well as vitamin and herbal supplements. It is possible to become pregnant while breastfeeding. If birth control is desired, ask your health care provider about options that will be safe for your baby. SEEK MEDICAL CARE IF:   You feel like you want to stop breastfeeding or have become frustrated with breastfeeding.  You have painful breasts or nipples.  Your nipples are cracked or bleeding.  Your breasts are red, tender, or warm.  You have a swollen area on either breast.  You have a fever or chills.  You have nausea or vomiting.  You have drainage other than breast milk from your nipples.  Your breasts do not become full before feedings by the fifth day after you give birth.  You feel sad and depressed.  Your baby is too sleepy to eat well.  Your baby is having trouble sleeping.   Your baby is wetting less than 3 diapers in a 24-hour period.  Your baby has less than 3 stools in a 24-hour period.  Your baby's skin or the white part of his or her eyes becomes yellow.   Your baby is not gaining weight by 53 days of age. SEEK IMMEDIATE MEDICAL CARE IF:   Your baby is overly tired (lethargic) and does not want to wake up and feed.  Your baby develops an unexplained fever. Document Released: 08/12/2005 Document Revised: 08/17/2013 Document Reviewed: 02/03/2013 [facility] Patient Information 2015 [place], [place].  This information is not intended to replace advice given to you by your health care provider. Make sure you discuss any questions you have with your health care provider.

## 2014-08-03 ENCOUNTER — Ambulatory Visit (HOSPITAL_COMMUNITY)
Admission: RE | Admit: 2014-08-03 | Discharge: 2014-08-03 | Disposition: A | Discharge status: Home / Self Care | Payer: Managed Care, Other (non HMO) | Source: Ambulatory Visit | Attending: Family Medicine | Admitting: Family Medicine

## 2014-08-03 ENCOUNTER — Other Ambulatory Visit: Payer: Self-pay | Admitting: Family Medicine

## 2014-08-03 DIAGNOSIS — Z3689 Encounter for other specified antenatal screening: Secondary | ICD-10-CM | POA: Insufficient documentation

## 2014-08-03 DIAGNOSIS — Z3481 Encounter for supervision of other normal pregnancy, first trimester: Secondary | ICD-10-CM

## 2014-08-03 DIAGNOSIS — Z3A20 20 weeks gestation of pregnancy: Secondary | ICD-10-CM | POA: Diagnosis not present

## 2014-08-03 DIAGNOSIS — Z36 Encounter for antenatal screening of mother: Secondary | ICD-10-CM | POA: Diagnosis present

## 2014-08-23 ENCOUNTER — Ambulatory Visit (INDEPENDENT_AMBULATORY_CARE_PROVIDER_SITE_OTHER): Payer: Managed Care, Other (non HMO) | Admitting: Obstetrics & Gynecology

## 2014-08-23 ENCOUNTER — Encounter: Payer: Self-pay | Admitting: Obstetrics & Gynecology

## 2014-08-23 VITALS — BP 136/66 | HR 85 | Wt 167.0 lb

## 2014-08-23 DIAGNOSIS — Z3482 Encounter for supervision of other normal pregnancy, second trimester: Secondary | ICD-10-CM

## 2014-08-23 NOTE — Progress Notes (Signed)
Routine visit. Good FM. No problems. Declines flu vaccine. Glucola, labs at next visit.

## 2014-08-26 NOTE — L&D Delivery Note (Signed)
Delivery Note Admitted with spontaneous onset of labor, progressed to 6 cm, and received an epidural.  Contractions/progress stalled following epidural, so patient was AROM'd & augmented with Pitocin, and progressed to full dilation.  Patient pushed for about 15 minutes with variable decels. At 1:45 PM a viable and healthy female was delivered via Vaginal, Spontaneous Delivery (Presentation: ; Occiput Anterior).  APGAR: 9, 9; weight pending .   Placenta status: spontaneous,intact .  Cord: 3 vessels with the following complications: None.  Cord pH: n/a  Anesthesia: Epidural  Episiotomy: None Lacerations: 1st degree Suture Repair: 3.0 vicryl Est. Blood Loss (mL):  82 Mom to postpartum.  Baby to Couplet care / Skin to Skin.  Delivery supervised by Wynelle BourgeoisMarie Yoanna Jurczyk, RN & [name], MD.  [name],[name] 12/24/2014, 2:10 PM   I was gloved and present for the entire delivery No difficulty with shoulders Agree with note above Aviva SignsMarie L Valor [place], CNM

## 2014-08-30 ENCOUNTER — Encounter: Payer: Managed Care, Other (non HMO) | Admitting: Family Medicine

## 2014-09-20 ENCOUNTER — Encounter: Payer: Self-pay | Admitting: Obstetrics and Gynecology

## 2014-09-20 ENCOUNTER — Ambulatory Visit (INDEPENDENT_AMBULATORY_CARE_PROVIDER_SITE_OTHER): Payer: Managed Care, Other (non HMO) | Admitting: Obstetrics and Gynecology

## 2014-09-20 VITALS — BP 136/76 | HR 92 | Wt 174.0 lb

## 2014-09-20 DIAGNOSIS — Z23 Encounter for immunization: Secondary | ICD-10-CM

## 2014-09-20 DIAGNOSIS — Z3482 Encounter for supervision of other normal pregnancy, second trimester: Secondary | ICD-10-CM

## 2014-09-20 DIAGNOSIS — O09899 Supervision of other high risk pregnancies, unspecified trimester: Secondary | ICD-10-CM

## 2014-09-20 NOTE — Progress Notes (Signed)
Patient is doing well without complaints. FM/PTL precautions reviewed. 1hr GCT, labs and TDap today.

## 2014-09-20 NOTE — Addendum Note (Signed)
Addended by: Tandy GawHINTON, Con Arganbright C on: 09/20/2014 09:56 AM   Modules accepted: Orders

## 2014-09-21 LAB — CBC
HCT: 36.3 % (ref 36.0–46.0)
Hemoglobin: 12.3 g/dL (ref 12.0–15.0)
MCH: 32.2 pg (ref 26.0–34.0)
MCHC: 33.9 g/dL (ref 30.0–36.0)
MCV: 95 fL (ref 78.0–100.0)
MPV: 9.7 fL (ref 8.6–12.4)
Platelets: 217 10*3/uL (ref 150–400)
RBC: 3.82 MIL/uL — AB (ref 3.87–5.11)
RDW: 12.7 % (ref 11.5–15.5)
WBC: 9.1 10*3/uL (ref 4.0–10.5)

## 2014-09-21 LAB — HIV ANTIBODY (ROUTINE TESTING W REFLEX): HIV: NONREACTIVE

## 2014-09-21 LAB — RPR

## 2014-09-24 LAB — GLUCOSE TOLERANCE, 1 HOUR (50G) W/O FASTING: GLUCOSE 1 HOUR GTT: 86 mg/dL (ref 70–140)

## 2014-10-11 ENCOUNTER — Ambulatory Visit (INDEPENDENT_AMBULATORY_CARE_PROVIDER_SITE_OTHER): Payer: Managed Care, Other (non HMO) | Admitting: Obstetrics & Gynecology

## 2014-10-11 ENCOUNTER — Encounter: Payer: Self-pay | Admitting: Obstetrics & Gynecology

## 2014-10-11 VITALS — BP 114/78 | HR 95 | Wt 177.6 lb

## 2014-10-11 DIAGNOSIS — Z3483 Encounter for supervision of other normal pregnancy, third trimester: Secondary | ICD-10-CM

## 2014-10-11 DIAGNOSIS — O2243 Hemorrhoids in pregnancy, third trimester: Secondary | ICD-10-CM

## 2014-10-11 MED ORDER — HYDROCORTISONE 2.5 % RE CREA: 1.0000 "application " | TOPICAL_CREAM | Freq: Two times a day (BID) | RECTAL | Status: DC

## 2014-10-11 NOTE — Patient Instructions (Signed)
Return to clinic for any obstetric concerns or go to MAU for evaluation  

## 2014-10-11 NOTE — Progress Notes (Signed)
Anusol cream prescribed for symptomatic hemorrhoids. No other complaints or concerns.  Labor and fetal movement precautions reviewed.

## 2014-10-25 ENCOUNTER — Ambulatory Visit (INDEPENDENT_AMBULATORY_CARE_PROVIDER_SITE_OTHER): Payer: Managed Care, Other (non HMO) | Admitting: Obstetrics & Gynecology

## 2014-10-25 ENCOUNTER — Encounter: Payer: Self-pay | Admitting: Obstetrics & Gynecology

## 2014-10-25 VITALS — BP 140/78 | HR 89 | Wt 178.0 lb

## 2014-10-25 DIAGNOSIS — O09899 Supervision of other high risk pregnancies, unspecified trimester: Secondary | ICD-10-CM

## 2014-10-25 NOTE — Progress Notes (Signed)
Routine visit. Good FM. Normal aches of pregnancy. Reassurance given. Declines flu vaccine again.

## 2014-11-08 ENCOUNTER — Ambulatory Visit (INDEPENDENT_AMBULATORY_CARE_PROVIDER_SITE_OTHER): Payer: Managed Care, Other (non HMO) | Admitting: Obstetrics & Gynecology

## 2014-11-08 VITALS — BP 106/71 | HR 90 | Wt 181.0 lb

## 2014-11-08 DIAGNOSIS — O09899 Supervision of other high risk pregnancies, unspecified trimester: Secondary | ICD-10-CM

## 2014-11-08 DIAGNOSIS — Z3483 Encounter for supervision of other normal pregnancy, third trimester: Secondary | ICD-10-CM

## 2014-11-08 NOTE — Progress Notes (Signed)
Routine visit. Good FM. No problems. Cultures at next visit 

## 2014-11-22 ENCOUNTER — Ambulatory Visit (INDEPENDENT_AMBULATORY_CARE_PROVIDER_SITE_OTHER): Payer: Managed Care, Other (non HMO) | Admitting: Family Medicine

## 2014-11-22 VITALS — BP 133/78 | HR 103 | Wt 183.0 lb

## 2014-11-22 DIAGNOSIS — Z3483 Encounter for supervision of other normal pregnancy, third trimester: Secondary | ICD-10-CM

## 2014-11-22 DIAGNOSIS — Z36 Encounter for antenatal screening of mother: Secondary | ICD-10-CM | POA: Diagnosis not present

## 2014-11-22 DIAGNOSIS — Z3493 Encounter for supervision of normal pregnancy, unspecified, third trimester: Secondary | ICD-10-CM

## 2014-11-22 LAB — OB RESULTS CONSOLE GC/CHLAMYDIA
Chlamydia: NEGATIVE
Gonorrhea: NEGATIVE

## 2014-11-22 LAB — OB RESULTS CONSOLE GBS: GBS: NEGATIVE

## 2014-11-22 NOTE — Progress Notes (Signed)
Cultures today Labor precuations

## 2014-11-22 NOTE — Patient Instructions (Signed)
Third Trimester of Pregnancy The third trimester is from week 29 through week 42, months 7 through 9. The third trimester is a time when the fetus is growing rapidly. At the end of the ninth month, the fetus is about 20 inches in length and weighs 6-10 pounds.  BODY CHANGES Your body goes through many changes during pregnancy. The changes vary from woman to woman.   Your weight will continue to increase. You can expect to gain 25-35 pounds (11-16 kg) by the end of the pregnancy.  You may begin to get stretch marks on your hips, abdomen, and breasts.  You may urinate more often because the fetus is moving lower into your pelvis and pressing on your bladder.  You may develop or continue to have heartburn as a result of your pregnancy.  You may develop constipation because certain hormones are causing the muscles that push waste through your intestines to slow down.  You may develop hemorrhoids or swollen, bulging veins (varicose veins).  You may have pelvic pain because of the weight gain and pregnancy hormones relaxing your joints between the bones in your pelvis. Backaches may result from overexertion of the muscles supporting your posture.  You may have changes in your hair. These can include thickening of your hair, rapid growth, and changes in texture. Some women also have hair loss during or after pregnancy, or hair that feels dry or thin. Your hair will most likely return to normal after your baby is born.  Your breasts will continue to grow and be tender. A yellow discharge may leak from your breasts called colostrum.  Your belly button may stick out.  You may feel short of breath because of your expanding uterus.  You may notice the fetus "dropping," or moving lower in your abdomen.  You may have a bloody mucus discharge. This usually occurs a few days to a week before labor begins.  Your cervix becomes thin and soft (effaced) near your due date. WHAT TO EXPECT AT YOUR  PRENATAL EXAMS  You will have prenatal exams every 2 weeks until week 36. Then, you will have weekly prenatal exams. During a routine prenatal visit:  You will be weighed to make sure you and the fetus are growing normally.  Your blood pressure is taken.  Your abdomen will be measured to track your baby's growth.  The fetal heartbeat will be listened to.  Any test results from the previous visit will be discussed.  You may have a cervical check near your due date to see if you have effaced. At around 36 weeks, your caregiver will check your cervix. At the same time, your caregiver will also perform a test on the secretions of the vaginal tissue. This test is to determine if a type of bacteria, Group B streptococcus, is present. Your caregiver will explain this further. Your caregiver may ask you:  What your birth plan is.  How you are feeling.  If you are feeling the baby move.  If you have had any abnormal symptoms, such as leaking fluid, bleeding, severe headaches, or abdominal cramping.  If you have any questions. Other tests or screenings that may be performed during your third trimester include:  Blood tests that check for low iron levels (anemia).  Fetal testing to check the health, activity level, and growth of the fetus. Testing is done if you have certain medical conditions or if there are problems during the pregnancy. FALSE LABOR You may feel small, irregular contractions that   eventually go away. These are called Braxton Hicks contractions, or false labor. Contractions may last for hours, days, or even weeks before true labor sets in. If contractions come at regular intervals, intensify, or become painful, it is best to be seen by your caregiver.  SIGNS OF LABOR   Menstrual-like cramps.  Contractions that are 5 minutes apart or less.  Contractions that start on the top of the uterus and spread down to the lower abdomen and back.  A sense of increased pelvic  pressure or back pain.  A watery or bloody mucus discharge that comes from the vagina. If you have any of these signs before the 37th week of pregnancy, call your caregiver right away. You need to go to the hospital to get checked immediately. HOME CARE INSTRUCTIONS   Avoid all smoking, herbs, alcohol, and unprescribed drugs. These chemicals affect the formation and growth of the baby.  Follow your caregiver's instructions regarding medicine use. There are medicines that are either safe or unsafe to take during pregnancy.  Exercise only as directed by your caregiver. Experiencing uterine cramps is a good sign to stop exercising.  Continue to eat regular, healthy meals.  Wear a good support bra for breast tenderness.  Do not use hot tubs, steam rooms, or saunas.  Wear your seat belt at all times when driving.  Avoid raw meat, uncooked cheese, cat litter boxes, and soil used by cats. These carry germs that can cause birth defects in the baby.  Take your prenatal vitamins.  Try taking a stool softener (if your caregiver approves) if you develop constipation. Eat more high-fiber foods, such as fresh vegetables or fruit and whole grains. Drink plenty of fluids to keep your urine clear or pale yellow.  Take warm sitz baths to soothe any pain or discomfort caused by hemorrhoids. Use hemorrhoid cream if your caregiver approves.  If you develop varicose veins, wear support hose. Elevate your feet for 15 minutes, 3-4 times a day. Limit salt in your diet.  Avoid heavy lifting, wear low heal shoes, and practice good posture.  Rest a lot with your legs elevated if you have leg cramps or low back pain.  Visit your dentist if you have not gone during your pregnancy. Use a soft toothbrush to brush your teeth and be gentle when you floss.  A sexual relationship may be continued unless your caregiver directs you otherwise.  Do not travel far distances unless it is absolutely necessary and only  with the approval of your caregiver.  Take prenatal classes to understand, practice, and ask questions about the labor and delivery.  Make a trial run to the hospital.  Pack your hospital bag.  Prepare the baby's nursery.  Continue to go to all your prenatal visits as directed by your caregiver. SEEK MEDICAL CARE IF:  You are unsure if you are in labor or if your water has broken.  You have dizziness.  You have mild pelvic cramps, pelvic pressure, or nagging pain in your abdominal area.  You have persistent nausea, vomiting, or diarrhea.  You have a bad smelling vaginal discharge.  You have pain with urination. SEEK IMMEDIATE MEDICAL CARE IF:   You have a fever.  You are leaking fluid from your vagina.  You have spotting or bleeding from your vagina.  You have severe abdominal cramping or pain.  You have rapid weight loss or gain.  You have shortness of breath with chest pain.  You notice sudden or extreme swelling   of your face, hands, ankles, feet, or legs.  You have not felt your baby move in over an hour.  You have severe headaches that do not go away with medicine.  You have vision changes. Document Released: 08/06/2001 Document Revised: 08/17/2013 Document Reviewed: 10/13/2012 [employer] Patient Information 2015 [employer]. This information is not intended to replace advice given to you by your health care provider. Make sure you discuss any questions you have with your health care provider.  Breastfeeding Deciding to breastfeed is one of the best choices you can make for you and your baby. A change in hormones during pregnancy causes your breast tissue to grow and increases the number and size of your milk ducts. These hormones also allow proteins, sugars, and fats from your blood supply to make breast milk in your milk-producing glands. Hormones prevent breast milk from being released before your baby is born as well as prompt milk flow after birth. Once  breastfeeding has begun, thoughts of your baby, as well as his or her sucking or crying, can stimulate the release of milk from your milk-producing glands.  BENEFITS OF BREASTFEEDING For Your Baby  Your first milk (colostrum) helps your baby's digestive system function better.   There are antibodies in your milk that help your baby fight off infections.   Your baby has a lower incidence of asthma, allergies, and sudden infant death syndrome.   The nutrients in breast milk are better for your baby than infant formulas and are designed uniquely for your baby's needs.   Breast milk improves your baby's brain development.   Your baby is less likely to develop other conditions, such as childhood obesity, asthma, or type 2 diabetes mellitus.  For You   Breastfeeding helps to create a very special bond between you and your baby.   Breastfeeding is convenient. Breast milk is always available at the correct temperature and costs nothing.   Breastfeeding helps to burn calories and helps you lose the weight gained during pregnancy.   Breastfeeding makes your uterus contract to its prepregnancy size faster and slows bleeding (lochia) after you give birth.   Breastfeeding helps to lower your risk of developing type 2 diabetes mellitus, osteoporosis, and breast or ovarian cancer later in life. SIGNS THAT YOUR BABY IS HUNGRY Early Signs of Hunger  Increased alertness or activity.  Stretching.  Movement of the head from side to side.  Movement of the head and opening of the mouth when the corner of the mouth or cheek is stroked (rooting).  Increased sucking sounds, smacking lips, cooing, sighing, or squeaking.  Hand-to-mouth movements.  Increased sucking of fingers or hands. Late Signs of Hunger  Fussing.  Intermittent crying. Extreme Signs of Hunger Signs of extreme hunger will require calming and consoling before your baby will be able to breastfeed successfully. Do not  wait for the following signs of extreme hunger to occur before you initiate breastfeeding:   Restlessness.  A loud, strong cry.   Screaming. BREASTFEEDING BASICS Breastfeeding Initiation  Find a comfortable place to sit or lie down, with your neck and back well supported.  Place a pillow or rolled up blanket under your baby to bring him or her to the level of your breast (if you are seated). Nursing pillows are specially designed to help support your arms and your baby while you breastfeed.  Make sure that your baby's abdomen is facing your abdomen.   Gently massage your breast. With your fingertips, massage from your chest   wall toward your nipple in a circular motion. This encourages milk flow. You may need to continue this action during the feeding if your milk flows slowly.  Support your breast with 4 fingers underneath and your thumb above your nipple. Make sure your fingers are well away from your nipple and your baby's mouth.   Stroke your baby's lips gently with your finger or nipple.   When your baby's mouth is open wide enough, quickly bring your baby to your breast, placing your entire nipple and as much of the colored area around your nipple (areola) as possible into your baby's mouth.   More areola should be visible above your baby's upper lip than below the lower lip.   Your baby's tongue should be between his or her lower gum and your breast.   Ensure that your baby's mouth is correctly positioned around your nipple (latched). Your baby's lips should create a seal on your breast and be turned out (everted).  It is common for your baby to suck about 2-3 minutes in order to start the flow of breast milk. Latching Teaching your baby how to latch on to your breast properly is very important. An improper latch can cause nipple pain and decreased milk supply for you and poor weight gain in your baby. Also, if your baby is not latched onto your nipple properly, he or she  may swallow some air during feeding. This can make your baby fussy. Burping your baby when you switch breasts during the feeding can help to get rid of the air. However, teaching your baby to latch on properly is still the best way to prevent fussiness from swallowing air while breastfeeding. Signs that your baby has successfully latched on to your nipple:    Silent tugging or silent sucking, without causing you pain.   Swallowing heard between every 3-4 sucks.    Muscle movement above and in front of his or her ears while sucking.  Signs that your baby has not successfully latched on to nipple:   Sucking sounds or smacking sounds from your baby while breastfeeding.  Nipple pain. If you think your baby has not latched on correctly, slip your finger into the corner of your baby's mouth to break the suction and place it between your baby's gums. Attempt breastfeeding initiation again. Signs of Successful Breastfeeding Signs from your baby:   A gradual decrease in the number of sucks or complete cessation of sucking.   Falling asleep.   Relaxation of his or her body.   Retention of a small amount of milk in his or her mouth.   Letting go of your breast by himself or herself. Signs from you:  Breasts that have increased in firmness, weight, and size 1-3 hours after feeding.   Breasts that are softer immediately after breastfeeding.  Increased milk volume, as well as a change in milk consistency and color by the fifth day of breastfeeding.   Nipples that are not sore, cracked, or bleeding. Signs That Your Baby is Getting Enough Milk  Wetting at least 3 diapers in a 24-hour period. The urine should be clear and pale yellow by age 5 days.  At least 3 stools in a 24-hour period by age 5 days. The stool should be soft and yellow.  At least 3 stools in a 24-hour period by age 7 days. The stool should be seedy and yellow.  No loss of weight greater than 10% of birth weight  during the first 3   days of age.  Average weight gain of 4-7 ounces (113-198 g) per week after age 4 days.  Consistent daily weight gain by age 5 days, without weight loss after the age of 2 weeks. After a feeding, your baby may spit up a small amount. This is common. BREASTFEEDING FREQUENCY AND DURATION Frequent feeding will help you make more milk and can prevent sore nipples and breast engorgement. Breastfeed when you feel the need to reduce the fullness of your breasts or when your baby shows signs of hunger. This is called "breastfeeding on demand." Avoid introducing a pacifier to your baby while you are working to establish breastfeeding (the first 4-6 weeks after your baby is born). After this time you may choose to use a pacifier. Research has shown that pacifier use during the first year of a baby's life decreases the risk of sudden infant death syndrome (SIDS). Allow your baby to feed on each breast as long as he or she wants. Breastfeed until your baby is finished feeding. When your baby unlatches or falls asleep while feeding from the first breast, offer the second breast. Because newborns are often sleepy in the first few weeks of life, you may need to awaken your baby to get him or her to feed. Breastfeeding times will vary from baby to baby. However, the following rules can serve as a guide to help you ensure that your baby is properly fed:  Newborns (babies 4 weeks of age or younger) may breastfeed every 1-3 hours.  Newborns should not go longer than 3 hours during the day or 5 hours during the night without breastfeeding.  You should breastfeed your baby a minimum of 8 times in a 24-hour period until you begin to introduce solid foods to your baby at around 6 months of age. BREAST MILK PUMPING Pumping and storing breast milk allows you to ensure that your baby is exclusively fed your breast milk, even at times when you are unable to breastfeed. This is especially important if you are  going back to work while you are still breastfeeding or when you are not able to be present during feedings. Your lactation consultant can give you guidelines on how long it is safe to store breast milk.  A breast pump is a machine that allows you to pump milk from your breast into a sterile bottle. The pumped breast milk can then be stored in a refrigerator or freezer. Some breast pumps are operated by hand, while others use electricity. Ask your lactation consultant which type will work best for you. Breast pumps can be purchased, but some hospitals and breastfeeding support groups lease breast pumps on a monthly basis. A lactation consultant can teach you how to hand express breast milk, if you prefer not to use a pump.  CARING FOR YOUR BREASTS WHILE YOU BREASTFEED Nipples can become dry, cracked, and sore while breastfeeding. The following recommendations can help keep your breasts moisturized and healthy:  Avoid using soap on your nipples.   Wear a supportive bra. Although not required, special nursing bras and tank tops are designed to allow access to your breasts for breastfeeding without taking off your entire bra or top. Avoid wearing underwire-style bras or extremely tight bras.  Air dry your nipples for 3-4minutes after each feeding.   Use only cotton bra pads to absorb leaked breast milk. Leaking of breast milk between feedings is normal.   Use lanolin on your nipples after breastfeeding. Lanolin helps to maintain your skin's   normal moisture barrier. If you use pure lanolin, you do not need to wash it off before feeding your baby again. Pure lanolin is not toxic to your baby. You may also hand express a few drops of breast milk and gently massage that milk into your nipples and allow the milk to air dry. In the first few weeks after giving birth, some women experience extremely full breasts (engorgement). Engorgement can make your breasts feel heavy, warm, and tender to the touch.  Engorgement peaks within 3-5 days after you give birth. The following recommendations can help ease engorgement:  Completely empty your breasts while breastfeeding or pumping. You may want to start by applying warm, moist heat (in the shower or with warm water-soaked hand towels) just before feeding or pumping. This increases circulation and helps the milk flow. If your baby does not completely empty your breasts while breastfeeding, pump any extra milk after he or she is finished.  Wear a snug bra (nursing or regular) or tank top for 1-2 days to signal your body to slightly decrease milk production.  Apply ice packs to your breasts, unless this is too uncomfortable for you.  Make sure that your baby is latched on and positioned properly while breastfeeding. If engorgement persists after 48 hours of following these recommendations, contact your health care provider or a lactation consultant. OVERALL HEALTH CARE RECOMMENDATIONS WHILE BREASTFEEDING  Eat healthy foods. Alternate between meals and snacks, eating 3 of each per day. Because what you eat affects your breast milk, some of the foods may make your baby more irritable than usual. Avoid eating these foods if you are sure that they are negatively affecting your baby.  Drink milk, fruit juice, and water to satisfy your thirst (about 10 glasses a day).   Rest often, relax, and continue to take your prenatal vitamins to prevent fatigue, stress, and anemia.  Continue breast self-awareness checks.  Avoid chewing and smoking tobacco.  Avoid alcohol and drug use. Some medicines that may be harmful to your baby can pass through breast milk. It is important to ask your health care provider before taking any medicine, including all over-the-counter and prescription medicine as well as vitamin and herbal supplements. It is possible to become pregnant while breastfeeding. If birth control is desired, ask your health care provider about options that  will be safe for your baby. SEEK MEDICAL CARE IF:   You feel like you want to stop breastfeeding or have become frustrated with breastfeeding.  You have painful breasts or nipples.  Your nipples are cracked or bleeding.  Your breasts are red, tender, or warm.  You have a swollen area on either breast.  You have a fever or chills.  You have nausea or vomiting.  You have drainage other than breast milk from your nipples.  Your breasts do not become full before feedings by the fifth day after you give birth.  You feel sad and depressed.  Your baby is too sleepy to eat well.  Your baby is having trouble sleeping.   Your baby is wetting less than 3 diapers in a 24-hour period.  Your baby has less than 3 stools in a 24-hour period.  Your baby's skin or the white part of his or her eyes becomes yellow.   Your baby is not gaining weight by 5 days of age. SEEK IMMEDIATE MEDICAL CARE IF:   Your baby is overly tired (lethargic) and does not want to wake up and feed.  Your baby   develops an unexplained fever. Document Released: 08/12/2005 Document Revised: 08/17/2013 Document Reviewed: 02/03/2013 [employer] Patient Information 2015 [employer]. This information is not intended to replace advice given to you by your health care provider. Make sure you discuss any questions you have with your health care provider.  

## 2014-11-23 LAB — GC/CHLAMYDIA PROBE AMP
CT Probe RNA: NEGATIVE
GC Probe RNA: NEGATIVE

## 2014-11-24 LAB — CULTURE, BETA STREP (GROUP B ONLY)

## 2014-12-01 ENCOUNTER — Ambulatory Visit (INDEPENDENT_AMBULATORY_CARE_PROVIDER_SITE_OTHER): Payer: Managed Care, Other (non HMO) | Admitting: Family Medicine

## 2014-12-01 VITALS — BP 124/80 | HR 105 | Wt 185.0 lb

## 2014-12-01 DIAGNOSIS — Z3483 Encounter for supervision of other normal pregnancy, third trimester: Secondary | ICD-10-CM

## 2014-12-01 NOTE — Patient Instructions (Signed)
Third Trimester of Pregnancy The third trimester is from week 29 through week 42, months 7 through 9. The third trimester is a time when the fetus is growing rapidly. At the end of the ninth month, the fetus is about 20 inches in length and weighs 6-10 pounds.  BODY CHANGES Your body goes through many changes during pregnancy. The changes vary from woman to woman.   Your weight will continue to increase. You can expect to gain 25-35 pounds (11-16 kg) by the end of the pregnancy.  You may begin to get stretch marks on your hips, abdomen, and breasts.  You may urinate more often because the fetus is moving lower into your pelvis and pressing on your bladder.  You may develop or continue to have heartburn as a result of your pregnancy.  You may develop constipation because certain hormones are causing the muscles that push waste through your intestines to slow down.  You may develop hemorrhoids or swollen, bulging veins (varicose veins).  You may have pelvic pain because of the weight gain and pregnancy hormones relaxing your joints between the bones in your pelvis. Backaches may result from overexertion of the muscles supporting your posture.  You may have changes in your hair. These can include thickening of your hair, rapid growth, and changes in texture. Some women also have hair loss during or after pregnancy, or hair that feels dry or thin. Your hair will most likely return to normal after your baby is born.  Your breasts will continue to grow and be tender. A yellow discharge may leak from your breasts called colostrum.  Your belly button may stick out.  You may feel short of breath because of your expanding uterus.  You may notice the fetus "dropping," or moving lower in your abdomen.  You may have a bloody mucus discharge. This usually occurs a few days to a week before labor begins.  Your cervix becomes thin and soft (effaced) near your due date. WHAT TO EXPECT AT YOUR  PRENATAL EXAMS  You will have prenatal exams every 2 weeks until week 36. Then, you will have weekly prenatal exams. During a routine prenatal visit:  You will be weighed to make sure you and the fetus are growing normally.  Your blood pressure is taken.  Your abdomen will be measured to track your baby's growth.  The fetal heartbeat will be listened to.  Any test results from the previous visit will be discussed.  You may have a cervical check near your due date to see if you have effaced. At around 36 weeks, your caregiver will check your cervix. At the same time, your caregiver will also perform a test on the secretions of the vaginal tissue. This test is to determine if a type of bacteria, Group B streptococcus, is present. Your caregiver will explain this further. Your caregiver may ask you:  What your birth plan is.  How you are feeling.  If you are feeling the baby move.  If you have had any abnormal symptoms, such as leaking fluid, bleeding, severe headaches, or abdominal cramping.  If you have any questions. Other tests or screenings that may be performed during your third trimester include:  Blood tests that check for low iron levels (anemia).  Fetal testing to check the health, activity level, and growth of the fetus. Testing is done if you have certain medical conditions or if there are problems during the pregnancy. FALSE LABOR You may feel small, irregular contractions that   eventually go away. These are called Braxton Hicks contractions, or false labor. Contractions may last for hours, days, or even weeks before true labor sets in. If contractions come at regular intervals, intensify, or become painful, it is best to be seen by your caregiver.  SIGNS OF LABOR   Menstrual-like cramps.  Contractions that are 5 minutes apart or less.  Contractions that start on the top of the uterus and spread down to the lower abdomen and back.  A sense of increased pelvic  pressure or back pain.  A watery or bloody mucus discharge that comes from the vagina. If you have any of these signs before the 37th week of pregnancy, call your caregiver right away. You need to go to the hospital to get checked immediately. HOME CARE INSTRUCTIONS   Avoid all smoking, herbs, alcohol, and unprescribed drugs. These chemicals affect the formation and growth of the baby.  Follow your caregiver's instructions regarding medicine use. There are medicines that are either safe or unsafe to take during pregnancy.  Exercise only as directed by your caregiver. Experiencing uterine cramps is a good sign to stop exercising.  Continue to eat regular, healthy meals.  Wear a good support bra for breast tenderness.  Do not use hot tubs, steam rooms, or saunas.  Wear your seat belt at all times when driving.  Avoid raw meat, uncooked cheese, cat litter boxes, and soil used by cats. These carry germs that can cause birth defects in the baby.  Take your prenatal vitamins.  Try taking a stool softener (if your caregiver approves) if you develop constipation. Eat more high-fiber foods, such as fresh vegetables or fruit and whole grains. Drink plenty of fluids to keep your urine clear or pale yellow.  Take warm sitz baths to soothe any pain or discomfort caused by hemorrhoids. Use hemorrhoid cream if your caregiver approves.  If you develop varicose veins, wear support hose. Elevate your feet for 15 minutes, 3-4 times a day. Limit salt in your diet.  Avoid heavy lifting, wear low heal shoes, and practice good posture.  Rest a lot with your legs elevated if you have leg cramps or low back pain.  Visit your dentist if you have not gone during your pregnancy. Use a soft toothbrush to brush your teeth and be gentle when you floss.  A sexual relationship may be continued unless your caregiver directs you otherwise.  Do not travel far distances unless it is absolutely necessary and only  with the approval of your caregiver.  Take prenatal classes to understand, practice, and ask questions about the labor and delivery.  Make a trial run to the hospital.  Pack your hospital bag.  Prepare the baby's nursery.  Continue to go to all your prenatal visits as directed by your caregiver. SEEK MEDICAL CARE IF:  You are unsure if you are in labor or if your water has broken.  You have dizziness.  You have mild pelvic cramps, pelvic pressure, or nagging pain in your abdominal area.  You have persistent nausea, vomiting, or diarrhea.  You have a bad smelling vaginal discharge.  You have pain with urination. SEEK IMMEDIATE MEDICAL CARE IF:   You have a fever.  You are leaking fluid from your vagina.  You have spotting or bleeding from your vagina.  You have severe abdominal cramping or pain.  You have rapid weight loss or gain.  You have shortness of breath with chest pain.  You notice sudden or extreme swelling   of your face, hands, ankles, feet, or legs.  You have not felt your baby move in over an hour.  You have severe headaches that do not go away with medicine.  You have vision changes. Document Released: 08/06/2001 Document Revised: 08/17/2013 Document Reviewed: 10/13/2012 [employer] Patient Information 2015 [employer]. This information is not intended to replace advice given to you by your health care provider. Make sure you discuss any questions you have with your health care provider.  Breastfeeding Deciding to breastfeed is one of the best choices you can make for you and your baby. A change in hormones during pregnancy causes your breast tissue to grow and increases the number and size of your milk ducts. These hormones also allow proteins, sugars, and fats from your blood supply to make breast milk in your milk-producing glands. Hormones prevent breast milk from being released before your baby is born as well as prompt milk flow after birth. Once  breastfeeding has begun, thoughts of your baby, as well as his or her sucking or crying, can stimulate the release of milk from your milk-producing glands.  BENEFITS OF BREASTFEEDING For Your Baby  Your first milk (colostrum) helps your baby's digestive system function better.   There are antibodies in your milk that help your baby fight off infections.   Your baby has a lower incidence of asthma, allergies, and sudden infant death syndrome.   The nutrients in breast milk are better for your baby than infant formulas and are designed uniquely for your baby's needs.   Breast milk improves your baby's brain development.   Your baby is less likely to develop other conditions, such as childhood obesity, asthma, or type 2 diabetes mellitus.  For You   Breastfeeding helps to create a very special bond between you and your baby.   Breastfeeding is convenient. Breast milk is always available at the correct temperature and costs nothing.   Breastfeeding helps to burn calories and helps you lose the weight gained during pregnancy.   Breastfeeding makes your uterus contract to its prepregnancy size faster and slows bleeding (lochia) after you give birth.   Breastfeeding helps to lower your risk of developing type 2 diabetes mellitus, osteoporosis, and breast or ovarian cancer later in life. SIGNS THAT YOUR BABY IS HUNGRY Early Signs of Hunger  Increased alertness or activity.  Stretching.  Movement of the head from side to side.  Movement of the head and opening of the mouth when the corner of the mouth or cheek is stroked (rooting).  Increased sucking sounds, smacking lips, cooing, sighing, or squeaking.  Hand-to-mouth movements.  Increased sucking of fingers or hands. Late Signs of Hunger  Fussing.  Intermittent crying. Extreme Signs of Hunger Signs of extreme hunger will require calming and consoling before your baby will be able to breastfeed successfully. Do not  wait for the following signs of extreme hunger to occur before you initiate breastfeeding:   Restlessness.  A loud, strong cry.   Screaming. BREASTFEEDING BASICS Breastfeeding Initiation  Find a comfortable place to sit or lie down, with your neck and back well supported.  Place a pillow or rolled up blanket under your baby to bring him or her to the level of your breast (if you are seated). Nursing pillows are specially designed to help support your arms and your baby while you breastfeed.  Make sure that your baby's abdomen is facing your abdomen.   Gently massage your breast. With your fingertips, massage from your chest   wall toward your nipple in a circular motion. This encourages milk flow. You may need to continue this action during the feeding if your milk flows slowly.  Support your breast with 4 fingers underneath and your thumb above your nipple. Make sure your fingers are well away from your nipple and your baby's mouth.   Stroke your baby's lips gently with your finger or nipple.   When your baby's mouth is open wide enough, quickly bring your baby to your breast, placing your entire nipple and as much of the colored area around your nipple (areola) as possible into your baby's mouth.   More areola should be visible above your baby's upper lip than below the lower lip.   Your baby's tongue should be between his or her lower gum and your breast.   Ensure that your baby's mouth is correctly positioned around your nipple (latched). Your baby's lips should create a seal on your breast and be turned out (everted).  It is common for your baby to suck about 2-3 minutes in order to start the flow of breast milk. Latching Teaching your baby how to latch on to your breast properly is very important. An improper latch can cause nipple pain and decreased milk supply for you and poor weight gain in your baby. Also, if your baby is not latched onto your nipple properly, he or she  may swallow some air during feeding. This can make your baby fussy. Burping your baby when you switch breasts during the feeding can help to get rid of the air. However, teaching your baby to latch on properly is still the best way to prevent fussiness from swallowing air while breastfeeding. Signs that your baby has successfully latched on to your nipple:    Silent tugging or silent sucking, without causing you pain.   Swallowing heard between every 3-4 sucks.    Muscle movement above and in front of his or her ears while sucking.  Signs that your baby has not successfully latched on to nipple:   Sucking sounds or smacking sounds from your baby while breastfeeding.  Nipple pain. If you think your baby has not latched on correctly, slip your finger into the corner of your baby's mouth to break the suction and place it between your baby's gums. Attempt breastfeeding initiation again. Signs of Successful Breastfeeding Signs from your baby:   A gradual decrease in the number of sucks or complete cessation of sucking.   Falling asleep.   Relaxation of his or her body.   Retention of a small amount of milk in his or her mouth.   Letting go of your breast by himself or herself. Signs from you:  Breasts that have increased in firmness, weight, and size 1-3 hours after feeding.   Breasts that are softer immediately after breastfeeding.  Increased milk volume, as well as a change in milk consistency and color by the fifth day of breastfeeding.   Nipples that are not sore, cracked, or bleeding. Signs That Your Baby is Getting Enough Milk  Wetting at least 3 diapers in a 24-hour period. The urine should be clear and pale yellow by age 5 days.  At least 3 stools in a 24-hour period by age 5 days. The stool should be soft and yellow.  At least 3 stools in a 24-hour period by age 7 days. The stool should be seedy and yellow.  No loss of weight greater than 10% of birth weight  during the first 3   days of age.  Average weight gain of 4-7 ounces (113-198 g) per week after age 4 days.  Consistent daily weight gain by age 5 days, without weight loss after the age of 2 weeks. After a feeding, your baby may spit up a small amount. This is common. BREASTFEEDING FREQUENCY AND DURATION Frequent feeding will help you make more milk and can prevent sore nipples and breast engorgement. Breastfeed when you feel the need to reduce the fullness of your breasts or when your baby shows signs of hunger. This is called "breastfeeding on demand." Avoid introducing a pacifier to your baby while you are working to establish breastfeeding (the first 4-6 weeks after your baby is born). After this time you may choose to use a pacifier. Research has shown that pacifier use during the first year of a baby's life decreases the risk of sudden infant death syndrome (SIDS). Allow your baby to feed on each breast as long as he or she wants. Breastfeed until your baby is finished feeding. When your baby unlatches or falls asleep while feeding from the first breast, offer the second breast. Because newborns are often sleepy in the first few weeks of life, you may need to awaken your baby to get him or her to feed. Breastfeeding times will vary from baby to baby. However, the following rules can serve as a guide to help you ensure that your baby is properly fed:  Newborns (babies 4 weeks of age or younger) may breastfeed every 1-3 hours.  Newborns should not go longer than 3 hours during the day or 5 hours during the night without breastfeeding.  You should breastfeed your baby a minimum of 8 times in a 24-hour period until you begin to introduce solid foods to your baby at around 6 months of age. BREAST MILK PUMPING Pumping and storing breast milk allows you to ensure that your baby is exclusively fed your breast milk, even at times when you are unable to breastfeed. This is especially important if you are  going back to work while you are still breastfeeding or when you are not able to be present during feedings. Your lactation consultant can give you guidelines on how long it is safe to store breast milk.  A breast pump is a machine that allows you to pump milk from your breast into a sterile bottle. The pumped breast milk can then be stored in a refrigerator or freezer. Some breast pumps are operated by hand, while others use electricity. Ask your lactation consultant which type will work best for you. Breast pumps can be purchased, but some hospitals and breastfeeding support groups lease breast pumps on a monthly basis. A lactation consultant can teach you how to hand express breast milk, if you prefer not to use a pump.  CARING FOR YOUR BREASTS WHILE YOU BREASTFEED Nipples can become dry, cracked, and sore while breastfeeding. The following recommendations can help keep your breasts moisturized and healthy:  Avoid using soap on your nipples.   Wear a supportive bra. Although not required, special nursing bras and tank tops are designed to allow access to your breasts for breastfeeding without taking off your entire bra or top. Avoid wearing underwire-style bras or extremely tight bras.  Air dry your nipples for 3-4minutes after each feeding.   Use only cotton bra pads to absorb leaked breast milk. Leaking of breast milk between feedings is normal.   Use lanolin on your nipples after breastfeeding. Lanolin helps to maintain your skin's   normal moisture barrier. If you use pure lanolin, you do not need to wash it off before feeding your baby again. Pure lanolin is not toxic to your baby. You may also hand express a few drops of breast milk and gently massage that milk into your nipples and allow the milk to air dry. In the first few weeks after giving birth, some women experience extremely full breasts (engorgement). Engorgement can make your breasts feel heavy, warm, and tender to the touch.  Engorgement peaks within 3-5 days after you give birth. The following recommendations can help ease engorgement:  Completely empty your breasts while breastfeeding or pumping. You may want to start by applying warm, moist heat (in the shower or with warm water-soaked hand towels) just before feeding or pumping. This increases circulation and helps the milk flow. If your baby does not completely empty your breasts while breastfeeding, pump any extra milk after he or she is finished.  Wear a snug bra (nursing or regular) or tank top for 1-2 days to signal your body to slightly decrease milk production.  Apply ice packs to your breasts, unless this is too uncomfortable for you.  Make sure that your baby is latched on and positioned properly while breastfeeding. If engorgement persists after 48 hours of following these recommendations, contact your health care provider or a lactation consultant. OVERALL HEALTH CARE RECOMMENDATIONS WHILE BREASTFEEDING  Eat healthy foods. Alternate between meals and snacks, eating 3 of each per day. Because what you eat affects your breast milk, some of the foods may make your baby more irritable than usual. Avoid eating these foods if you are sure that they are negatively affecting your baby.  Drink milk, fruit juice, and water to satisfy your thirst (about 10 glasses a day).   Rest often, relax, and continue to take your prenatal vitamins to prevent fatigue, stress, and anemia.  Continue breast self-awareness checks.  Avoid chewing and smoking tobacco.  Avoid alcohol and drug use. Some medicines that may be harmful to your baby can pass through breast milk. It is important to ask your health care provider before taking any medicine, including all over-the-counter and prescription medicine as well as vitamin and herbal supplements. It is possible to become pregnant while breastfeeding. If birth control is desired, ask your health care provider about options that  will be safe for your baby. SEEK MEDICAL CARE IF:   You feel like you want to stop breastfeeding or have become frustrated with breastfeeding.  You have painful breasts or nipples.  Your nipples are cracked or bleeding.  Your breasts are red, tender, or warm.  You have a swollen area on either breast.  You have a fever or chills.  You have nausea or vomiting.  You have drainage other than breast milk from your nipples.  Your breasts do not become full before feedings by the fifth day after you give birth.  You feel sad and depressed.  Your baby is too sleepy to eat well.  Your baby is having trouble sleeping.   Your baby is wetting less than 3 diapers in a 24-hour period.  Your baby has less than 3 stools in a 24-hour period.  Your baby's skin or the white part of his or her eyes becomes yellow.   Your baby is not gaining weight by 5 days of age. SEEK IMMEDIATE MEDICAL CARE IF:   Your baby is overly tired (lethargic) and does not want to wake up and feed.  Your baby   develops an unexplained fever. Document Released: 08/12/2005 Document Revised: 08/17/2013 Document Reviewed: 02/03/2013 [employer] Patient Information 2015 [employer]. This information is not intended to replace advice given to you by your health care provider. Make sure you discuss any questions you have with your health care provider.  

## 2014-12-01 NOTE — Progress Notes (Signed)
Doing well Having some BH contractions--Labor precautions reviewed.

## 2014-12-08 ENCOUNTER — Ambulatory Visit (INDEPENDENT_AMBULATORY_CARE_PROVIDER_SITE_OTHER): Payer: Managed Care, Other (non HMO) | Admitting: Obstetrics and Gynecology

## 2014-12-08 ENCOUNTER — Encounter: Payer: Managed Care, Other (non HMO) | Admitting: Obstetrics and Gynecology

## 2014-12-08 ENCOUNTER — Encounter: Payer: Self-pay | Admitting: Obstetrics and Gynecology

## 2014-12-08 VITALS — BP 115/76 | HR 99 | Wt 185.0 lb

## 2014-12-08 DIAGNOSIS — O09899 Supervision of other high risk pregnancies, unspecified trimester: Secondary | ICD-10-CM

## 2014-12-08 DIAGNOSIS — Z3483 Encounter for supervision of other normal pregnancy, third trimester: Secondary | ICD-10-CM

## 2014-12-08 NOTE — Progress Notes (Signed)
Patient is doing well without complaints. FM/labor precautions reviewed 

## 2014-12-14 ENCOUNTER — Ambulatory Visit (INDEPENDENT_AMBULATORY_CARE_PROVIDER_SITE_OTHER): Payer: Managed Care, Other (non HMO) | Admitting: Obstetrics & Gynecology

## 2014-12-14 VITALS — BP 115/72 | HR 96 | Wt 187.0 lb

## 2014-12-14 DIAGNOSIS — Z3483 Encounter for supervision of other normal pregnancy, third trimester: Secondary | ICD-10-CM

## 2014-12-14 NOTE — Patient Instructions (Signed)
Return to clinic for any obstetric concerns or go to MAU for evaluation  

## 2014-12-14 NOTE — Progress Notes (Signed)
No cervical change.  Postdates testing to start next visit.  No other complaints or concerns.  Labor and fetal movement precautions reviewed.

## 2014-12-16 ENCOUNTER — Encounter: Payer: Managed Care, Other (non HMO) | Admitting: Family Medicine

## 2014-12-20 ENCOUNTER — Ambulatory Visit (INDEPENDENT_AMBULATORY_CARE_PROVIDER_SITE_OTHER): Payer: Managed Care, Other (non HMO) | Admitting: Obstetrics & Gynecology

## 2014-12-20 ENCOUNTER — Encounter: Payer: Self-pay | Admitting: Obstetrics & Gynecology

## 2014-12-20 VITALS — BP 126/79 | HR 97 | Wt 189.0 lb

## 2014-12-20 DIAGNOSIS — Z3483 Encounter for supervision of other normal pregnancy, third trimester: Secondary | ICD-10-CM

## 2014-12-20 NOTE — Progress Notes (Signed)
Unscheduled visit. She has been having cramping and passing her mucous plug. She reports good FM, denies VB or ROM. If she has not delivered by Thursday, then come in for NST. Labor/FM precautions reviewed.

## 2014-12-21 ENCOUNTER — Encounter (HOSPITAL_COMMUNITY): Payer: Self-pay | Admitting: *Deleted

## 2014-12-21 ENCOUNTER — Encounter: Payer: Managed Care, Other (non HMO) | Admitting: Family Medicine

## 2014-12-21 ENCOUNTER — Inpatient Hospital Stay (HOSPITAL_COMMUNITY)
Admission: AD | Admit: 2014-12-21 | Discharge: 2014-12-21 | Disposition: A | Discharge status: Home / Self Care | Payer: Managed Care, Other (non HMO) | Source: Ambulatory Visit | Attending: Family Medicine | Admitting: Family Medicine

## 2014-12-21 DIAGNOSIS — Z3A40 40 weeks gestation of pregnancy: Secondary | ICD-10-CM | POA: Diagnosis not present

## 2014-12-21 DIAGNOSIS — O09899 Supervision of other high risk pregnancies, unspecified trimester: Secondary | ICD-10-CM

## 2014-12-21 NOTE — Discharge Instructions (Signed)

## 2014-12-21 NOTE — MAU Note (Signed)
Pt reports contractions, denies bleeding or ROM.  

## 2014-12-21 NOTE — MAU Note (Signed)
PT SAYS HURT BAD     AT 0300.[facility]-  [place].  VE YESTERDAY 4 CM.   DENIES HSV AND   MRSA.  [facility]- NEG

## 2014-12-22 ENCOUNTER — Other Ambulatory Visit: Payer: Managed Care, Other (non HMO)

## 2014-12-23 ENCOUNTER — Ambulatory Visit (INDEPENDENT_AMBULATORY_CARE_PROVIDER_SITE_OTHER): Payer: Managed Care, Other (non HMO) | Admitting: Obstetrics and Gynecology

## 2014-12-23 ENCOUNTER — Inpatient Hospital Stay (HOSPITAL_COMMUNITY)
Admission: AD | Admit: 2014-12-23 | Discharge: 2014-12-23 | Disposition: A | Discharge status: Home / Self Care | Payer: Managed Care, Other (non HMO) | Source: Ambulatory Visit | Attending: Obstetrics & Gynecology | Admitting: Obstetrics & Gynecology

## 2014-12-23 ENCOUNTER — Ambulatory Visit (HOSPITAL_COMMUNITY)
Admission: RE | Admit: 2014-12-23 | Discharge: 2014-12-23 | Disposition: A | Discharge status: Home / Self Care | Payer: Managed Care, Other (non HMO) | Source: Ambulatory Visit | Attending: Obstetrics and Gynecology | Admitting: Obstetrics and Gynecology

## 2014-12-23 ENCOUNTER — Encounter (HOSPITAL_COMMUNITY): Payer: Self-pay | Admitting: Advanced Practice Midwife

## 2014-12-23 DIAGNOSIS — Z3A40 40 weeks gestation of pregnancy: Secondary | ICD-10-CM | POA: Insufficient documentation

## 2014-12-23 DIAGNOSIS — O288 Other abnormal findings on antenatal screening of mother: Secondary | ICD-10-CM | POA: Insufficient documentation

## 2014-12-23 DIAGNOSIS — O480 Post-term pregnancy: Secondary | ICD-10-CM

## 2014-12-23 DIAGNOSIS — O283 Abnormal ultrasonic finding on antenatal screening of mother: Secondary | ICD-10-CM

## 2014-12-23 DIAGNOSIS — O09899 Supervision of other high risk pregnancies, unspecified trimester: Secondary | ICD-10-CM

## 2014-12-23 NOTE — Progress Notes (Signed)
NST reviewed and non reactive. Patient sent for BPP

## 2014-12-23 NOTE — MAU Note (Signed)
Desires to have her cervix checked;

## 2014-12-23 NOTE — MAU Provider Note (Signed)
History     CSN: [id number]  Arrival date and time: 12/23/14 1232   None     Chief Complaint  Patient presents with  . Labor Eval   HPI This is a 29 y.o. female at 6748w4d who presents with c/o contractions. Was sent from clinic for BPP for a nonreactive NST. She has been scheduled for IOL next week so had an NST today. She has mild irregular contractions and wants to be checked.  Denies leaking or bleeding   RN Note:  Expand All Collapse All   Desires to have her cervix checked;          OB History    Gravida Para Term Preterm AB TAB SAB Ectopic Multiple Living   4 2 2  0 2 0 2 0 0 2      Past Medical History  Diagnosis Date  . Heart murmur   . Supervision of normal first pregnancy 02/19/2013    [facility]  Genetics: Anatomy: Glucola: GBS: Contraception: Feeding:     Past Surgical History  Procedure Laterality Date  . Mole removal      on back  . Wisdom tooth extraction      x 2  . Dilation and evacuation  07/17/2012    Procedure: DILATATION AND EVACUATION;  Surgeon: Fermin Schwabamer Yalcinkaya, MD;  Location: [facility];  Service: Gynecology;  Laterality: N/A;  chromosome studies    Family History  Problem Relation Age of Onset  . Alzheimer's disease Maternal Grandmother   . Osteoporosis Maternal Grandmother   . Hyperlipidemia Maternal Grandmother   . Stroke Paternal Grandfather   . Brain cancer Maternal Grandfather     History  Substance Use Topics  . Smoking status: Never Smoker   . Smokeless tobacco: Never Used  . Alcohol Use: No    Allergies: No Known Allergies  Prescriptions prior to admission  Medication Sig Dispense Refill Last Dose  . folic acid (FOLVITE) 400 MCG tablet Take 400 mcg by mouth daily.   12/20/2014 at Unknown time  . hydrocortisone (ANUSOL-HC) 2.5 % rectal cream Place 1 application rectally 2 (two) times daily. (Patient not taking: Reported on 12/21/2014) 28.35 g 4 Taking  . Multiple Vitamin (MULTIVITAMIN WITH MINERALS) TABS Take 3  tablets by mouth daily. Takes 3 gummies "Alive" daily.  This is the recommended dose.   12/20/2014 at Unknown time    Review of Systems  Constitutional: Negative for fever, chills and malaise/fatigue.  Eyes: Negative for blurred vision.  Gastrointestinal: Positive for abdominal pain. Negative for nausea, vomiting, diarrhea and constipation.  Musculoskeletal: Negative for myalgias and back pain.  Neurological: Negative for dizziness, weakness and headaches.   Physical Exam   Last menstrual period 03/15/2014, currently breastfeeding.  Physical Exam  Constitutional: She is oriented to person, place, and time. She appears well-developed and well-nourished. No distress.  HENT:  Head: Normocephalic.  Cardiovascular: Normal rate and regular rhythm.   Respiratory: Effort normal and breath sounds normal. No respiratory distress.  GI: Soft. There is no tenderness. There is no rebound and no guarding.  Genitourinary: Vagina normal. No vaginal discharge found.  Cervix 3-4/60/-3  No change from before    Musculoskeletal: Normal range of motion.  Neurological: She is alert and oriented to person, place, and time.  Skin: Skin is warm and dry.  Psychiatric: She has a normal mood and affect.   Fetal heart rate reactive Irregular mild contractions  MAU Course  Procedures  MDM Discussed labor precautions  Reviewed normal  BPP with 8/8 score NST reactive  Assessment and Plan  A:  SIUP at [redacted]w[redacted]d      Latent labor with no change in cervix      Reactive NST with BPP 8/8  P:  Consulted Dr Debroah Loop       D/C home       Labor precautions       Fetal movment precautions  [facility] 12/23/2014, 1:14 PM

## 2014-12-23 NOTE — Discharge Instructions (Signed)
Third Trimester of Pregnancy °The third trimester is from week 29 through week 42, months 7 through 9. The third trimester is a time when the fetus is growing rapidly. At the end of the ninth month, the fetus is about 20 inches in length and weighs 6-10 pounds.  °BODY CHANGES °Your body goes through many changes during pregnancy. The changes vary from woman to woman.  °· Your weight will continue to increase. You can expect to gain 25-35 pounds (11-16 kg) by the end of the pregnancy. °· You may begin to get stretch marks on your hips, abdomen, and breasts. °· You may urinate more often because the fetus is moving lower into your pelvis and pressing on your bladder. °· You may develop or continue to have heartburn as a result of your pregnancy. °· You may develop constipation because certain hormones are causing the muscles that push waste through your intestines to slow down. °· You may develop hemorrhoids or swollen, bulging veins (varicose veins). °· You may have pelvic pain because of the weight gain and pregnancy hormones relaxing your joints between the bones in your pelvis. Backaches may result from overexertion of the muscles supporting your posture. °· You may have changes in your hair. These can include thickening of your hair, rapid growth, and changes in texture. Some women also have hair loss during or after pregnancy, or hair that feels dry or thin. Your hair will most likely return to normal after your baby is born. °· Your breasts will continue to grow and be tender. A yellow discharge may leak from your breasts called colostrum. °· Your belly button may stick out. °· You may feel short of breath because of your expanding uterus. °· You may notice the fetus "dropping," or moving lower in your abdomen. °· You may have a bloody mucus discharge. This usually occurs a few days to a week before labor begins. °· Your cervix becomes thin and soft (effaced) near your due date. °WHAT TO EXPECT AT YOUR PRENATAL  EXAMS  °You will have prenatal exams every 2 weeks until week 36. Then, you will have weekly prenatal exams. During a routine prenatal visit: °· You will be weighed to make sure you and the fetus are growing normally. °· Your blood pressure is taken. °· Your abdomen will be measured to track your baby's growth. °· The fetal heartbeat will be listened to. °· Any test results from the previous visit will be discussed. °· You may have a cervical check near your due date to see if you have effaced. °At around 36 weeks, your caregiver will check your cervix. At the same time, your caregiver will also perform a test on the secretions of the vaginal tissue. This test is to determine if a type of bacteria, Group B streptococcus, is present. Your caregiver will explain this further. °Your caregiver may ask you: °· What your birth plan is. °· How you are feeling. °· If you are feeling the baby move. °· If you have had any abnormal symptoms, such as leaking fluid, bleeding, severe headaches, or abdominal cramping. °· If you have any questions. °Other tests or screenings that may be performed during your third trimester include: °· Blood tests that check for low iron levels (anemia). °· Fetal testing to check the health, activity level, and growth of the fetus. Testing is done if you have certain medical conditions or if there are problems during the pregnancy. °FALSE LABOR °You may feel small, irregular contractions that   eventually go away. These are called Braxton Hicks contractions, or false labor. Contractions may last for hours, days, or even weeks before true labor sets in. If contractions come at regular intervals, intensify, or become painful, it is best to be seen by your caregiver.  °SIGNS OF LABOR  °· Menstrual-like cramps. °· Contractions that are 5 minutes apart or less. °· Contractions that start on the top of the uterus and spread down to the lower abdomen and back. °· A sense of increased pelvic pressure or back  pain. °· A watery or bloody mucus discharge that comes from the vagina. °If you have any of these signs before the 37th week of pregnancy, call your caregiver right away. You need to go to the hospital to get checked immediately. °HOME CARE INSTRUCTIONS  °· Avoid all smoking, herbs, alcohol, and unprescribed drugs. These chemicals affect the formation and growth of the baby. °· Follow your caregiver's instructions regarding medicine use. There are medicines that are either safe or unsafe to take during pregnancy. °· Exercise only as directed by your caregiver. Experiencing uterine cramps is a good sign to stop exercising. °· Continue to eat regular, healthy meals. °· Wear a good support bra for breast tenderness. °· Do not use hot tubs, steam rooms, or saunas. °· Wear your seat belt at all times when driving. °· Avoid raw meat, uncooked cheese, cat litter boxes, and soil used by cats. These carry germs that can cause birth defects in the baby. °· Take your prenatal vitamins. °· Try taking a stool softener (if your caregiver approves) if you develop constipation. Eat more high-fiber foods, such as fresh vegetables or fruit and whole grains. Drink plenty of fluids to keep your urine clear or pale yellow. °· Take warm sitz baths to soothe any pain or discomfort caused by hemorrhoids. Use hemorrhoid cream if your caregiver approves. °· If you develop varicose veins, wear support hose. Elevate your feet for 15 minutes, 3-4 times a day. Limit salt in your diet. °· Avoid heavy lifting, wear low heal shoes, and practice good posture. °· Rest a lot with your legs elevated if you have leg cramps or low back pain. °· Visit your dentist if you have not gone during your pregnancy. Use a soft toothbrush to brush your teeth and be gentle when you floss. °· A sexual relationship may be continued unless your caregiver directs you otherwise. °· Do not travel far distances unless it is absolutely necessary and only with the approval  of your caregiver. °· Take prenatal classes to understand, practice, and ask questions about the labor and delivery. °· Make a trial run to the hospital. °· Pack your hospital bag. °· Prepare the baby's nursery. °· Continue to go to all your prenatal visits as directed by your caregiver. °SEEK MEDICAL CARE IF: °· You are unsure if you are in labor or if your water has broken. °· You have dizziness. °· You have mild pelvic cramps, pelvic pressure, or nagging pain in your abdominal area. °· You have persistent nausea, vomiting, or diarrhea. °· You have a bad smelling vaginal discharge. °· You have pain with urination. °SEEK IMMEDIATE MEDICAL CARE IF:  °· You have a fever. °· You are leaking fluid from your vagina. °· You have spotting or bleeding from your vagina. °· You have severe abdominal cramping or pain. °· You have rapid weight loss or gain. °· You have shortness of breath with chest pain. °· You notice sudden or extreme swelling   of your face, hands, ankles, feet, or legs. °· You have not felt your baby move in over an hour. °· You have severe headaches that do not go away with medicine. °· You have vision changes. °Document Released: 08/06/2001 Document Revised: 08/17/2013 Document Reviewed: 10/13/2012 °Exit[employer]® Patient Information ©2015 [employer]. This information is not intended to replace advice given to you by your health care provider. Make sure you discuss any questions you have with your health care provider. °Fetal Movement Counts °Patient Name: __________________________________________________ Patient Due Date: ____________________ °Performing a fetal movement count is highly recommended in high-risk pregnancies, but it is good for every pregnant woman to do. Your health care provider may ask you to start counting fetal movements at 28 weeks of the pregnancy. Fetal movements often increase: °· After eating a full meal. °· After physical activity. °· After eating or drinking something sweet or  cold. °· At rest. °Pay attention to when you feel the baby is most active. This will help you notice a pattern of your baby's sleep and wake cycles and what factors contribute to an increase in fetal movement. It is important to perform a fetal movement count at the same time each day when your baby is normally most active.  °HOW TO COUNT FETAL MOVEMENTS °· Find a quiet and comfortable area to sit or lie down on your left side. Lying on your left side provides the best blood and oxygen circulation to your baby. °· Write down the day and time on a sheet of paper or in a journal. °· Start counting kicks, flutters, swishes, rolls, or jabs in a 2-hour period. You should feel at least 10 movements within 2 hours. °· If you do not feel 10 movements in 2 hours, wait 2-3 hours and count again. Look for a change in the pattern or not enough counts in 2 hours. °SEEK MEDICAL CARE IF: °· You feel less than 10 counts in 2 hours, tried twice. °· There is no movement in over an hour. °· The pattern is changing or taking longer each day to reach 10 counts in 2 hours. °· You feel the baby is not moving as he or she usually does. °Date: ____________ Movements: ____________ Start time: ____________ Finish time: ____________  °Date: ____________ Movements: ____________ Start time: ____________ Finish time: ____________ °Date: ____________ Movements: ____________ Start time: ____________ Finish time: ____________ °Date: ____________ Movements: ____________ Start time: ____________ Finish time: ____________ °Date: ____________ Movements: ____________ Start time: ____________ Finish time: ____________ °Date: ____________ Movements: ____________ Start time: ____________ Finish time: ____________ °Date: ____________ Movements: ____________ Start time: ____________ Finish time: ____________ °Date: ____________ Movements: ____________ Start time: ____________ Finish time: ____________  °Date: ____________ Movements: ____________ Start time:  ____________ Finish time: ____________ °Date: ____________ Movements: ____________ Start time: ____________ Finish time: ____________ °Date: ____________ Movements: ____________ Start time: ____________ Finish time: ____________ °Date: ____________ Movements: ____________ Start time: ____________ Finish time: ____________ °Date: ____________ Movements: ____________ Start time: ____________ Finish time: ____________ °Date: ____________ Movements: ____________ Start time: ____________ Finish time: ____________ °Date: ____________ Movements: ____________ Start time: ____________ Finish time: ____________  °Date: ____________ Movements: ____________ Start time: ____________ Finish time: ____________ °Date: ____________ Movements: ____________ Start time: ____________ Finish time: ____________ °Date: ____________ Movements: ____________ Start time: ____________ Finish time: ____________ °Date: ____________ Movements: ____________ Start time: ____________ Finish time: ____________ °Date: ____________ Movements: ____________ Start time: ____________ Finish time: ____________ °Date: ____________ Movements: ____________ Start time: ____________ Finish time: ____________ °Date: ____________ Movements: ____________ Start time: ____________ Finish time:   ____________  °Date: ____________ Movements: ____________ Start time: ____________ Finish time: ____________ °Date: ____________ Movements: ____________ Start time: ____________ Finish time: ____________ °Date: ____________ Movements: ____________ Start time: ____________ Finish time: ____________ °Date: ____________ Movements: ____________ Start time: ____________ Finish time: ____________ °Date: ____________ Movements: ____________ Start time: ____________ Finish time: ____________ °Date: ____________ Movements: ____________ Start time: ____________ Finish time: ____________ °Date: ____________ Movements: ____________ Start time: ____________ Finish time: ____________  °Date:  ____________ Movements: ____________ Start time: ____________ Finish time: ____________ °Date: ____________ Movements: ____________ Start time: ____________ Finish time: ____________ °Date: ____________ Movements: ____________ Start time: ____________ Finish time: ____________ °Date: ____________ Movements: ____________ Start time: ____________ Finish time: ____________ °Date: ____________ Movements: ____________ Start time: ____________ Finish time: ____________ °Date: ____________ Movements: ____________ Start time: ____________ Finish time: ____________ °Date: ____________ Movements: ____________ Start time: ____________ Finish time: ____________  °Date: ____________ Movements: ____________ Start time: ____________ Finish time: ____________ °Date: ____________ Movements: ____________ Start time: ____________ Finish time: ____________ °Date: ____________ Movements: ____________ Start time: ____________ Finish time: ____________ °Date: ____________ Movements: ____________ Start time: ____________ Finish time: ____________ °Date: ____________ Movements: ____________ Start time: ____________ Finish time: ____________ °Date: ____________ Movements: ____________ Start time: ____________ Finish time: ____________ °Date: ____________ Movements: ____________ Start time: ____________ Finish time: ____________  °Date: ____________ Movements: ____________ Start time: ____________ Finish time: ____________ °Date: ____________ Movements: ____________ Start time: ____________ Finish time: ____________ °Date: ____________ Movements: ____________ Start time: ____________ Finish time: ____________ °Date: ____________ Movements: ____________ Start time: ____________ Finish time: ____________ °Date: ____________ Movements: ____________ Start time: ____________ Finish time: ____________ °Date: ____________ Movements: ____________ Start time: ____________ Finish time: ____________ °Date: ____________ Movements: ____________ Start  time: ____________ Finish time: ____________  °Date: ____________ Movements: ____________ Start time: ____________ Finish time: ____________ °Date: ____________ Movements: ____________ Start time: ____________ Finish time: ____________ °Date: ____________ Movements: ____________ Start time: ____________ Finish time: ____________ °Date: ____________ Movements: ____________ Start time: ____________ Finish time: ____________ °Date: ____________ Movements: ____________ Start time: ____________ Finish time: ____________ °Date: ____________ Movements: ____________ Start time: ____________ Finish time: ____________ °Document Released: 09/11/2006 Document Revised: 12/27/2013 Document Reviewed: 06/08/2012 °[employer]® Patient Information ©2015 [employer]. This information is not intended to replace advice given to you by your health care provider. Make sure you discuss any questions you have with your health care provider. ° °

## 2014-12-24 ENCOUNTER — Inpatient Hospital Stay (HOSPITAL_COMMUNITY)
Admission: AD | Admit: 2014-12-24 | Discharge: 2014-12-25 | DRG: 775 | Disposition: A | Discharge status: Home / Self Care | Payer: Managed Care, Other (non HMO) | Source: Ambulatory Visit | Attending: Family Medicine | Admitting: Family Medicine

## 2014-12-24 ENCOUNTER — Encounter (HOSPITAL_COMMUNITY): Payer: Self-pay | Admitting: *Deleted

## 2014-12-24 ENCOUNTER — Inpatient Hospital Stay (HOSPITAL_COMMUNITY): Payer: Managed Care, Other (non HMO) | Admitting: Anesthesiology

## 2014-12-24 DIAGNOSIS — Z820 Family history of epilepsy and other diseases of the nervous system: Secondary | ICD-10-CM

## 2014-12-24 DIAGNOSIS — Z3A40 40 weeks gestation of pregnancy: Secondary | ICD-10-CM | POA: Diagnosis not present

## 2014-12-24 DIAGNOSIS — Z3483 Encounter for supervision of other normal pregnancy, third trimester: Secondary | ICD-10-CM

## 2014-12-24 DIAGNOSIS — Z823 Family history of stroke: Secondary | ICD-10-CM

## 2014-12-24 DIAGNOSIS — O471 False labor at or after 37 completed weeks of gestation: Secondary | ICD-10-CM | POA: Diagnosis present

## 2014-12-24 DIAGNOSIS — IMO0001 Reserved for inherently not codable concepts without codable children: Secondary | ICD-10-CM

## 2014-12-24 DIAGNOSIS — O09899 Supervision of other high risk pregnancies, unspecified trimester: Secondary | ICD-10-CM

## 2014-12-24 LAB — CBC
HEMATOCRIT: 34.1 % — AB (ref 36.0–46.0)
HEMOGLOBIN: 11.8 g/dL — AB (ref 12.0–15.0)
MCH: 31.3 pg (ref 26.0–34.0)
MCHC: 34.6 g/dL (ref 30.0–36.0)
MCV: 90.5 fL (ref 78.0–100.0)
Platelets: 174 10*3/uL (ref 150–400)
RBC: 3.77 MIL/uL — ABNORMAL LOW (ref 3.87–5.11)
RDW: 13.3 % (ref 11.5–15.5)
WBC: 11 10*3/uL — ABNORMAL HIGH (ref 4.0–10.5)

## 2014-12-24 LAB — HIV ANTIBODY (ROUTINE TESTING W REFLEX): HIV SCREEN 4TH GENERATION: NONREACTIVE

## 2014-12-24 LAB — TYPE AND SCREEN
ABO/RH(D): O POS
Antibody Screen: NEGATIVE

## 2014-12-24 LAB — RPR: RPR Ser Ql: NONREACTIVE

## 2014-12-24 MED ORDER — FENTANYL CITRATE (PF) 100 MCG/2ML IJ SOLN: 100.0000 ug | INTRAMUSCULAR | Status: DC | PRN

## 2014-12-24 MED ORDER — OXYTOCIN 40 UNITS IN LACTATED RINGERS INFUSION - SIMPLE MED: 62.5000 mL/h | INTRAVENOUS | Status: DC

## 2014-12-24 MED ORDER — LANOLIN HYDROUS EX OINT: TOPICAL_OINTMENT | CUTANEOUS | Status: DC | PRN

## 2014-12-24 MED ORDER — EPHEDRINE 5 MG/ML INJ
10.0000 mg | INTRAVENOUS | Status: DC | PRN
  Filled 2014-12-24: qty 2

## 2014-12-24 MED ORDER — LACTATED RINGERS IV SOLN
INTRAVENOUS | Status: DC
  Administered 2014-12-24 (×2): via INTRAVENOUS

## 2014-12-24 MED ORDER — ONDANSETRON HCL 4 MG/2ML IJ SOLN
4.0000 mg | Freq: Four times a day (QID) | INTRAMUSCULAR | Status: DC | PRN
Start: 2014-12-24 — End: 2014-12-24

## 2014-12-24 MED ORDER — TETANUS-DIPHTH-ACELL PERTUSSIS 5-2.5-18.5 LF-MCG/0.5 IM SUSP: 0.5000 mL | Freq: Once | INTRAMUSCULAR | Status: DC

## 2014-12-24 MED ORDER — LACTATED RINGERS IV SOLN
500.0000 mL | INTRAVENOUS | Status: DC | PRN
  Administered 2014-12-24 (×2): 1000 mL via INTRAVENOUS

## 2014-12-24 MED ORDER — ONDANSETRON HCL 4 MG/2ML IJ SOLN: 4.0000 mg | INTRAMUSCULAR | Status: DC | PRN

## 2014-12-24 MED ORDER — CITRIC ACID-SODIUM CITRATE 334-500 MG/5ML PO SOLN: 30.0000 mL | ORAL | Status: DC | PRN

## 2014-12-24 MED ORDER — WITCH HAZEL-GLYCERIN EX PADS: 1.0000 "application " | MEDICATED_PAD | CUTANEOUS | Status: DC | PRN

## 2014-12-24 MED ORDER — ACETAMINOPHEN 325 MG PO TABS: 650.0000 mg | ORAL_TABLET | ORAL | Status: DC | PRN

## 2014-12-24 MED ORDER — DIBUCAINE 1 % RE OINT: 1.0000 "application " | TOPICAL_OINTMENT | RECTAL | Status: DC | PRN

## 2014-12-24 MED ORDER — TERBUTALINE SULFATE 1 MG/ML IJ SOLN
0.2500 mg | Freq: Once | INTRAMUSCULAR | Status: DC | PRN
  Filled 2014-12-24: qty 1

## 2014-12-24 MED ORDER — LIDOCAINE HCL (PF) 1 % IJ SOLN
INTRAMUSCULAR | Status: DC | PRN
  Administered 2014-12-24 (×2): 8 mL

## 2014-12-24 MED ORDER — DIPHENHYDRAMINE HCL 50 MG/ML IJ SOLN
12.5000 mg | INTRAMUSCULAR | Status: DC | PRN
Start: 2014-12-24 — End: 2014-12-24

## 2014-12-24 MED ORDER — ONDANSETRON HCL 4 MG PO TABS: 4.0000 mg | ORAL_TABLET | ORAL | Status: DC | PRN

## 2014-12-24 MED ORDER — LIDOCAINE HCL (PF) 1 % IJ SOLN
30.0000 mL | INTRAMUSCULAR | Status: DC | PRN
  Filled 2014-12-24: qty 30

## 2014-12-24 MED ORDER — OXYTOCIN 40 UNITS IN LACTATED RINGERS INFUSION - SIMPLE MED: 62.5000 mL/h | INTRAVENOUS | Status: DC | PRN

## 2014-12-24 MED ORDER — OXYTOCIN 40 UNITS IN LACTATED RINGERS INFUSION - SIMPLE MED
1.0000 m[IU]/min | INTRAVENOUS | Status: DC
Start: 2014-12-24 — End: 2014-12-24
  Administered 2014-12-24: 2 m[IU]/min via INTRAVENOUS
  Filled 2014-12-24: qty 1000

## 2014-12-24 MED ORDER — ZOLPIDEM TARTRATE 5 MG PO TABS: 5.0000 mg | ORAL_TABLET | Freq: Every evening | ORAL | Status: DC | PRN

## 2014-12-24 MED ORDER — SIMETHICONE 80 MG PO CHEW: 80.0000 mg | CHEWABLE_TABLET | ORAL | Status: DC | PRN

## 2014-12-24 MED ORDER — SENNOSIDES-DOCUSATE SODIUM 8.6-50 MG PO TABS
2.0000 | ORAL_TABLET | ORAL | Status: DC
  Administered 2014-12-24: 2 via ORAL
  Filled 2014-12-24: qty 2

## 2014-12-24 MED ORDER — IBUPROFEN 600 MG PO TABS
600.0000 mg | ORAL_TABLET | Freq: Four times a day (QID) | ORAL | Status: DC
Start: 2014-12-24 — End: 2014-12-25
  Administered 2014-12-24 – 2014-12-25 (×4): 600 mg via ORAL
  Filled 2014-12-24 (×4): qty 1

## 2014-12-24 MED ORDER — OXYCODONE-ACETAMINOPHEN 5-325 MG PO TABS: 2.0000 | ORAL_TABLET | ORAL | Status: DC | PRN

## 2014-12-24 MED ORDER — PRENATAL MULTIVITAMIN CH
1.0000 | ORAL_TABLET | Freq: Every day | ORAL | Status: DC
  Administered 2014-12-25: 1 via ORAL
  Filled 2014-12-24: qty 1

## 2014-12-24 MED ORDER — OXYTOCIN BOLUS FROM INFUSION: 500.0000 mL | INTRAVENOUS | Status: DC

## 2014-12-24 MED ORDER — PHENYLEPHRINE 40 MCG/ML (10ML) SYRINGE FOR IV PUSH (FOR BLOOD PRESSURE SUPPORT)
80.0000 ug | PREFILLED_SYRINGE | INTRAVENOUS | Status: DC | PRN
  Filled 2014-12-24: qty 20
  Filled 2014-12-24: qty 2

## 2014-12-24 MED ORDER — BENZOCAINE-MENTHOL 20-0.5 % EX AERO: 1.0000 "application " | INHALATION_SPRAY | CUTANEOUS | Status: DC | PRN

## 2014-12-24 MED ORDER — OXYCODONE-ACETAMINOPHEN 5-325 MG PO TABS: 1.0000 | ORAL_TABLET | ORAL | Status: DC | PRN

## 2014-12-24 MED ORDER — FENTANYL 2.5 MCG/ML BUPIVACAINE 1/10 % EPIDURAL INFUSION (WH - ANES)
14.0000 mL/h | INTRAMUSCULAR | Status: DC | PRN
  Administered 2014-12-24: 14 mL/h via EPIDURAL
  Filled 2014-12-24: qty 125

## 2014-12-24 MED ORDER — DIPHENHYDRAMINE HCL 25 MG PO CAPS: 25.0000 mg | ORAL_CAPSULE | Freq: Four times a day (QID) | ORAL | Status: DC | PRN

## 2014-12-24 NOTE — Progress Notes (Signed)
Diane LarocheMelissa J Allen Allen is a 29 y.o. [phone] at 3726w4d admitted for active labor  Subjective: Comfortable with epidural.  Objective: BP 105/64 mmHg  Pulse 98  Temp(Src) 97.8 F (36.6 C) (Axillary)  Resp 18  Ht 5\' 5"  (1.651 m)  Wt 84.369 kg (186 lb)  BMI 30.95 kg/m2  SpO2 99%  LMP 03/15/2014    FHT:  FHR: 125 bpm, variability: moderate,  accelerations:  Present,  decelerations:  Absent UC:   regular, every 6-7 minutes  SVE:   Dilation: 6.5 Effacement (%): 90 Station: -2 Exam by:: patti moore rn   Labs: Lab Results  Component Value Date   WBC 11.0* 12/24/2014   HGB 11.8* 12/24/2014   HCT 34.1* 12/24/2014   MCV 90.5 12/24/2014   PLT 174 12/24/2014    Assessment / Plan: Spontaneous labor, progressing normally  Labor: Progressing normally Fetal Wellbeing:  Category I Pain Control:  Epidural Pre-eclampsia: No s/s I/D:  GBS negative Anticipated MOD:  NSVD  [name] SNM 12/24/2014, 11:10 AM

## 2014-12-24 NOTE — Anesthesia Procedure Notes (Signed)
Epidural Patient location during procedure: OB Start time: 12/24/2014 7:16 AM End time: 12/24/2014 7:24 AM  Staffing Anesthesiologist: Leilani AbleHATCHETT, Irys Nigh Performed by: anesthesiologist   Preanesthetic Checklist Completed: patient identified, surgical consent, pre-op evaluation, timeout performed, IV checked, risks and benefits discussed and monitors and equipment checked  Epidural Patient position: sitting Prep: site prepped and draped and DuraPrep Patient monitoring: continuous pulse ox and blood pressure Approach: midline Location: L3-L4 Injection technique: LOR air  Needle:  Needle type: Tuohy  Needle gauge: 17 G Needle length: 9 cm and 9 Needle insertion depth: 6 cm Catheter type: closed end flexible Catheter size: 19 Gauge Catheter at skin depth: 11 cm Test dose: negative and Other  Assessment Sensory level: T9 Events: blood not aspirated, injection not painful, no injection resistance, negative IV test and no paresthesia  Additional Notes Reason for block:procedure for pain

## 2014-12-24 NOTE — Progress Notes (Signed)
Philipp DeputyKim Shaw CNM notified of pt's admission and status. Aware of ctx pattern, sve, neg gbs, desire for epidural. Orders received.

## 2014-12-24 NOTE — MAU Note (Signed)
Was here yesterday for BPP after nonreactive nst in office. WEnt home and ctxs starteed about 2145. Stronger and closer. Denies LOF or bleeding. 4cm yesterday

## 2014-12-24 NOTE — H&P (Signed)
[name] is a 29 y.o. female [phone] @ 40.4wks by LMP and confirmed by 11wk scan presenting for labor eval. Denies leaking or bldg. States ctx started becoming stronger between 8-9pm on 4/29. No H/A, N/V, or visual disturbances. Her preg has been [facility] by the [facility] and has been remarkable only for short preg interval.  History OB History    Gravida Para Term Preterm AB TAB SAB Ectopic Multiple Living   4 1 1  0 2 0 2 0 0 1     Past Medical History  Diagnosis Date  . Heart murmur   . Supervision of normal first pregnancy 02/19/2013    [facility]  Genetics: Anatomy: Glucola: GBS: Contraception: Feeding:    Past Surgical History  Procedure Laterality Date  . Mole removal      on back  . Wisdom tooth extraction      x 2  . Dilation and evacuation  07/17/2012    Procedure: DILATATION AND EVACUATION;  Surgeon: Fermin Schwabamer Yalcinkaya, MD;  Location: [facility];  Service: Gynecology;  Laterality: N/A;  chromosome studies   Family History: family history includes Alzheimer's disease in her maternal grandmother; Brain cancer in her maternal grandfather; Hyperlipidemia in her maternal grandmother; Osteoporosis in her maternal grandmother; Stroke in her paternal grandfather. Social History:  reports that she has never smoked. She has never used smokeless tobacco. She reports that she does not drink alcohol or use illicit drugs.   Prenatal Transfer Tool  Maternal Diabetes: No Genetic Screening: Declined Maternal Ultrasounds/Referrals: Normal Fetal Ultrasounds or other Referrals:  None Maternal Substance Abuse:  No Significant Maternal Medications:  None Significant Maternal Lab Results:  Lab values include: Group B Strep negative Other Comments:  None  ROS  Dilation: 4.5 Effacement (%): 90 Station: -2 Exam by:: Quintella BatonJo BArham rNC Blood pressure 129/84, pulse 97, temperature 97.4 F (36.3 C), resp. rate 22, height 5\' 5"  (1.651 m), weight 84.369 kg (186 lb),  last menstrual period 03/15/2014, currently breastfeeding. Exam Physical Exam  Constitutional: She is oriented to person, place, and time. She appears well-developed.  HENT:  Head: Normocephalic.  Neck: Normal range of motion.  Cardiovascular: Normal rate.   Respiratory: Effort normal.  GI:  EFM, +accels, no decels Ctx q 3 mins  Musculoskeletal: Normal range of motion.  Neurological: She is alert and oriented to person, place, and time.  Skin: Skin is warm and dry.  Psychiatric: She has a normal mood and affect. Her behavior is normal. Thought content normal.    Prenatal labs: ABO, Rh: O/POS/-- (09/16 1105) Antibody: NEG (09/16 1105) Rubella: 2.08 (09/16 1105) RPR: NON REAC (01/26 0956)  HBsAg: NEGATIVE (09/16 1105)  HIV: NONREACTIVE (01/26 0956)  GBS: Negative (03/29 0000)   Assessment/Plan: IUP@term  Early active labor  [facility] management Plans epidural Anticipate SVD   [name] CNM 12/24/2014, 7:05 AM

## 2014-12-24 NOTE — Anesthesia Preprocedure Evaluation (Signed)

## 2014-12-24 NOTE — Progress Notes (Signed)
Report called to Dana RN in BS. Ok for pt to come to 164 

## 2014-12-24 NOTE — Progress Notes (Signed)
Diane LarocheMelissa J Wilson Allen is a 29 y.o. [phone] at 4939w4d admitted for active labor  Subjective: Comfortable, no complaints.  Objective: BP 105/64 mmHg  Pulse 98  Temp(Src) 97.8 F (36.6 C) (Axillary)  Resp 18  Ht 5\' 5"  (1.651 m)  Wt 84.369 kg (186 lb)  BMI 30.95 kg/m2  SpO2 99%  LMP 03/15/2014    FHT:  FHR: 130 bpm, variability: moderate,  accelerations:  Present,  decelerations:  Absent UC:   irregular  SVE:   Dilation: 6.5 Effacement (%): 90 Station: -1 Exam by:: lisa ross cnm student   Labs: Lab Results  Component Value Date   WBC 11.0* 12/24/2014   HGB 11.8* 12/24/2014   HCT 34.1* 12/24/2014   MCV 90.5 12/24/2014   PLT 174 12/24/2014    Assessment / Plan: Arrest in active phase of labor  Labor: No progress in >2 hours, AROM and pitocin augmentation Fetal Wellbeing:  Category I Pain Control:  Epidural Pre-eclampsia: No s/s. I/D:  GBS neg Anticipated MOD:  NSVD  Ross,[name] SNM 12/24/2014, 12:10 PM  Seen also by me  Agree with note Fetal heart rate reassuring  UCs inadequate Will augment with Pitocin Aviva SignsMarie L Williams, CNM

## 2014-12-24 NOTE — Progress Notes (Signed)
Husband at bedside and very supportive

## 2014-12-25 NOTE — Anesthesia Postprocedure Evaluation (Signed)
  Anesthesia Post-op Note  Patient: Diane LarocheMelissa J Wilson Blanchard  Procedure(s) Performed: * No procedures listed *  Patient Location: Mother/Baby  Anesthesia Type:Epidural  Level of Consciousness: awake, alert  and oriented  Airway and Oxygen Therapy: Patient Spontanous Breathing  Post-op Pain: mild  Post-op Assessment: Patient's Cardiovascular Status Stable, Respiratory Function Stable, Patent Airway, No signs of Nausea or vomiting, Adequate PO intake, Pain level controlled, No headache, No backache, No residual numbness and No residual motor weakness  Post-op Vital Signs: Reviewed and stable  Last Vitals:  Filed Vitals:   12/25/14 0500  BP: 104/59  Pulse: 68  Temp: 36.7 C  Resp: 18    Complications: No apparent anesthesia complications

## 2014-12-25 NOTE — Discharge Instructions (Signed)

## 2014-12-25 NOTE — Lactation Note (Signed)
This note was copied from the chart of [name]. Lactation Consultation Note: Mother is  experienced with breastfeeding her last child for 6 months. Mother was given Nurse, learning disabilityLactation Brochure. Mother states that infant is breastfeeding well. Mother is having her lunch . Mother denies having any concerns or questions. Informed mother of available LC services .  Patient Name: [name] Today's Date: 12/25/2014 Reason for consult: Initial assessment   Maternal Data Has patient been taught Hand Expression?: Yes Does the patient have breastfeeding experience prior to this delivery?: Yes  Feeding Feeding Type: Breast Fed Length of feed: 14 min  LATCH Score/Interventions Latch: Grasps breast easily, tongue down, lips flanged, rhythmical sucking.  Audible Swallowing: Spontaneous and intermittent Intervention(s): Skin to skin Intervention(s): Skin to skin  Type of Nipple: Everted at rest and after stimulation  Comfort (Breast/Nipple): Soft / non-tender     Hold (Positioning): No assistance needed to correctly position infant at breast.  LATCH Score: 10  Lactation Tools Discussed/Used     Consult Status Consult Status: Follow-up Date: 12/25/14 Follow-up type: In-patient    Stevan BornKendrick, Luismario Coston [facility] 12/25/2014, 2:49 PM

## 2014-12-25 NOTE — Discharge Summary (Signed)
Obstetric Discharge Summary Reason for Admission: onset of labor Prenatal Procedures: none Intrapartum Procedures: spontaneous vaginal delivery Postpartum Procedures: none Complications-Operative and Postpartum: 1st degree perineal laceration Delivery Note Admitted with spontaneous onset of labor, progressed to 6 cm, and received an epidural. Contractions/progress stalled following epidural, so patient was AROM'd & augmented with Pitocin, and progressed to full dilation. Patient pushed for about 15 minutes with variable decels. At 1:45 PM a viable and healthy female was delivered via Vaginal, Spontaneous Delivery (Presentation: ; Occiput Anterior). APGAR: 9, 9; weight pending .  Placenta status: spontaneous,intact . Cord: 3 vessels with the following complications: None. Cord pH: n/a  Anesthesia: Epidural  Episiotomy: None Lacerations: 1st degree Suture Repair: 3.0 vicryl Est. Blood Loss (mL): 82 Mom to postpartum. Baby to Couplet care / Skin to Skin.  Delivery supervised by Diane BourgeoisMarie Williams, RN & [name], [name].  [name],[name] 12/24/2014, 2:10 PM   I was gloved and present for the entire delivery No difficulty with shoulders Agree with note above [name], [facility]  Hospital Course:  Active Problems:   Active labor   [name] is a 29 y.o. [phone] s/p SVD.  Patient was admitted in active labor.  She has postpartum course that was uncomplicated including no problems with ambulating, PO intake, urination, pain, or bleeding. The pt feels ready to go home and  will be discharged with outpatient follow-up.   Today: No acute events overnight.  Pt denies problems with ambulating, voiding or po intake.  She denies nausea or vomiting.  Pain is well controlled.  She has had flatus. Plan for birth control is  vasectomy.  Method of Feeding: breast  Physical Exam:  General: alert and cooperative Lochia: appropriate Uterine Fundus: firm DVT Evaluation: No  evidence of DVT seen on physical exam.  H/H: Lab Results  Component Value Date/Time   HGB 11.8* 12/24/2014 06:30 AM   HCT 34.1* 12/24/2014 06:30 AM    Discharge Diagnoses: Term Pregnancy-delivered  Discharge Information: Date: 12/25/2014 Activity: pelvic rest Diet: routine  Medications: None Breast feeding:  Yes Condition: stable Instructions: refer to handout Discharge to: home      Medication List    TAKE these medications        folic acid 400 MCG tablet  Commonly known as:  FOLVITE  Take 400 mcg by mouth daily.     multivitamin with minerals Tabs tablet  Take 3 tablets by mouth daily. Takes 3 gummies "Alive" daily.  This is the recommended dose.           Follow-up Information    [facility] Schedule an appointment as soon as possible for a visit in 6 weeks.   Specialty:  Obstetrics and Gynecology   Why:  postpartum follow up   Contact information:   [address] [place] [id number] [phone]      [name],[name] ,[name] OB Fellow 12/25/2014,8:54 AM

## 2015-01-30 ENCOUNTER — Ambulatory Visit (INDEPENDENT_AMBULATORY_CARE_PROVIDER_SITE_OTHER): Payer: Managed Care, Other (non HMO) | Admitting: Family Medicine

## 2015-01-30 ENCOUNTER — Encounter: Payer: Self-pay | Admitting: Family Medicine

## 2015-01-30 NOTE — Progress Notes (Signed)
  Subjective:     [name] is a 29 y.o. female who presents for a postpartum visit. She is 6 weeks postpartum following a spontaneous vaginal delivery. I have fully reviewed the prenatal and intrapartum course. The delivery was at 40 gestational weeks. Outcome: spontaneous vaginal delivery. Anesthesia: epidural. Postpartum course has been normal. Baby's course has been unremarkable. Baby is feeding by breast. Bleeding staining only. Bowel function is normal. Bladder function is normal. Patient is not sexually active. Contraception method is none. Postpartum depression screening: negative.  The following portions of the patient's history were reviewed and updated as appropriate: allergies, current medications, past family history, past medical history, past social history, past surgical history and problem list.  Review of Systems Pertinent items are noted in HPI.   Objective:    BP 124/78 mmHg  Pulse 73  Wt 168 lb (76.204 kg)  Breastfeeding? Yes  General:  alert, cooperative and appears stated age  Abdomen: soft, non-tender; bowel sounds normal; no masses,  no organomegaly   Vulva:  normal  Vagina: normal vagina, no discharge, exudate, lesion, or erythema  Cervix:  no bleeding following Pap and no cervical motion tenderness  Corpus: normal size, contour, position, consistency, mobility, non-tender  Adnexa:  normal adnexa        Assessment:     Normal postpartum exam. Pap smear not done at today's visit.   Plan:    1. Contraception: vasectomy 2. Pap due 02/2016 3. Follow up in: 1 year or as needed.

## 2015-01-30 NOTE — Patient Instructions (Signed)
Preventive Care for Adults A healthy lifestyle and preventive care can promote health and wellness. Preventive health guidelines for women include the following key practices.  A routine yearly physical is a good way to check with your health care provider about your health and preventive screening. It is a chance to share any concerns and updates on your health and to receive a thorough exam.  Visit your dentist for a routine exam and preventive care every 6 months. Brush your teeth twice a day and floss once a day. Good oral hygiene prevents tooth decay and gum disease.  The frequency of eye exams is based on your age, health, family medical history, use of contact lenses, and other factors. Follow your health care provider's recommendations for frequency of eye exams.  Eat a healthy diet. Foods like vegetables, fruits, whole grains, low-fat dairy products, and lean protein foods contain the nutrients you need without too many calories. Decrease your intake of foods high in solid fats, added sugars, and salt. Eat the right amount of calories for you.Get information about a proper diet from your health care provider, if necessary.  Regular physical exercise is one of the most important things you can do for your health. Most adults should get at least 150 minutes of moderate-intensity exercise (any activity that increases your heart rate and causes you to sweat) each week. In addition, most adults need muscle-strengthening exercises on 2 or more days a week.  Maintain a healthy weight. The body mass index (BMI) is a screening tool to identify possible weight problems. It provides an estimate of body fat based on height and weight. Your health care provider can find your BMI and can help you achieve or maintain a healthy weight.For adults 20 years and older:  A BMI below 18.5 is considered underweight.  A BMI of 18.5 to 24.9 is normal.  A BMI of 25 to 29.9 is considered overweight.  A BMI of  30 and above is considered obese.  Maintain normal blood lipids and cholesterol levels by exercising and minimizing your intake of saturated fat. Eat a balanced diet with plenty of fruit and vegetables. Blood tests for lipids and cholesterol should begin at age 76 and be repeated every 5 years. If your lipid or cholesterol levels are high, you are over 50, or you are at high risk for heart disease, you may need your cholesterol levels checked more frequently.Ongoing high lipid and cholesterol levels should be treated with medicines if diet and exercise are not working.  If you smoke, find out from your health care provider how to quit. If you do not use tobacco, do not start.  Lung cancer screening is recommended for adults aged 22-80 years who are at high risk for developing lung cancer because of a history of smoking. A yearly low-dose CT scan of the lungs is recommended for people who have at least a 30-pack-year history of smoking and are a current smoker or have quit within the past 15 years. A pack year of smoking is smoking an average of 1 pack of cigarettes a day for 1 year (for example: 1 pack a day for 30 years or 2 packs a day for 15 years). Yearly screening should continue until the smoker has stopped smoking for at least 15 years. Yearly screening should be stopped for people who develop a health problem that would prevent them from having lung cancer treatment.  If you are pregnant, do not drink alcohol. If you are breastfeeding,  be very cautious about drinking alcohol. If you are not pregnant and choose to drink alcohol, do not have more than 1 drink per day. One drink is considered to be 12 ounces (355 mL) of beer, 5 ounces (148 mL) of wine, or 1.5 ounces (44 mL) of liquor.  Avoid use of street drugs. Do not share needles with anyone. Ask for help if you need support or instructions about stopping the use of drugs.  High blood pressure causes heart disease and increases the risk of  stroke. Your blood pressure should be checked at least every 1 to 2 years. Ongoing high blood pressure should be treated with medicines if weight loss and exercise do not work.  If you are 75-52 years old, ask your health care provider if you should take aspirin to prevent strokes.  Diabetes screening involves taking a blood sample to check your fasting blood sugar level. This should be done once every 3 years, after age 15, if you are within normal weight and without risk factors for diabetes. Testing should be considered at a younger age or be carried out more frequently if you are overweight and have at least 1 risk factor for diabetes.  Breast cancer screening is essential preventive care for women. You should practice "breast self-awareness." This means understanding the normal appearance and feel of your breasts and may include breast self-examination. Any changes detected, no matter how small, should be reported to a health care provider. Women in their 58s and 30s should have a clinical breast exam (CBE) by a health care provider as part of a regular health exam every 1 to 3 years. After age 16, women should have a CBE every year. Starting at age 53, women should consider having a mammogram (breast X-ray test) every year. Women who have a family history of breast cancer should talk to their health care provider about genetic screening. Women at a high risk of breast cancer should talk to their health care providers about having an MRI and a mammogram every year.  Breast cancer gene (BRCA)-related cancer risk assessment is recommended for women who have family members with BRCA-related cancers. BRCA-related cancers include breast, ovarian, tubal, and peritoneal cancers. Having family members with these cancers may be associated with an increased risk for harmful changes (mutations) in the breast cancer genes BRCA1 and BRCA2. Results of the assessment will determine the need for genetic counseling and  BRCA1 and BRCA2 testing.  Routine pelvic exams to screen for cancer are no longer recommended for nonpregnant women who are considered low risk for cancer of the pelvic organs (ovaries, uterus, and vagina) and who do not have symptoms. Ask your health care provider if a screening pelvic exam is right for you.  If you have had past treatment for cervical cancer or a condition that could lead to cancer, you need Pap tests and screening for cancer for at least 20 years after your treatment. If Pap tests have been discontinued, your risk factors (such as having a new sexual partner) need to be reassessed to determine if screening should be resumed. Some women have medical problems that increase the chance of getting cervical cancer. In these cases, your health care provider may recommend more frequent screening and Pap tests.  The HPV test is an additional test that may be used for cervical cancer screening. The HPV test looks for the virus that can cause the cell changes on the cervix. The cells collected during the Pap test can be  tested for HPV. The HPV test could be used to screen women aged 30 years and older, and should be used in women of any age who have unclear Pap test results. After the age of 30, women should have HPV testing at the same frequency as a Pap test.  Colorectal cancer can be detected and often prevented. Most routine colorectal cancer screening begins at the age of 50 years and continues through age 75 years. However, your health care provider may recommend screening at an earlier age if you have risk factors for colon cancer. On a yearly basis, your health care provider may provide home test kits to check for hidden blood in the stool. Use of a small camera at the end of a tube, to directly examine the colon (sigmoidoscopy or colonoscopy), can detect the earliest forms of colorectal cancer. Talk to your health care provider about this at age 50, when routine screening begins. Direct  exam of the colon should be repeated every 5-10 years through age 75 years, unless early forms of pre-cancerous polyps or small growths are found.  People who are at an increased risk for hepatitis B should be screened for this virus. You are considered at high risk for hepatitis B if:  You were born in a country where hepatitis B occurs often. Talk with your health care provider about which countries are considered high risk.  Your parents were born in a high-risk country and you have not received a shot to protect against hepatitis B (hepatitis B vaccine).  You have HIV or AIDS.  You use needles to inject street drugs.  You live with, or have sex with, someone who has hepatitis B.  You get hemodialysis treatment.  You take certain medicines for conditions like cancer, organ transplantation, and autoimmune conditions.  Hepatitis C blood testing is recommended for all people born from 1945 through 1965 and any individual with known risks for hepatitis C.  Practice safe sex. Use condoms and avoid high-risk sexual practices to reduce the spread of sexually transmitted infections (STIs). STIs include gonorrhea, chlamydia, syphilis, trichomonas, herpes, HPV, and human immunodeficiency virus (HIV). Herpes, HIV, and HPV are viral illnesses that have no cure. They can result in disability, cancer, and death.  You should be screened for sexually transmitted illnesses (STIs) including gonorrhea and chlamydia if:  You are sexually active and are younger than 24 years.  You are older than 24 years and your health care provider tells you that you are at risk for this type of infection.  Your sexual activity has changed since you were last screened and you are at an increased risk for chlamydia or gonorrhea. Ask your health care provider if you are at risk.  If you are at risk of being infected with HIV, it is recommended that you take a prescription medicine daily to prevent HIV infection. This is  called preexposure prophylaxis (PrEP). You are considered at risk if:  You are a heterosexual woman, are sexually active, and are at increased risk for HIV infection.  You take drugs by injection.  You are sexually active with a partner who has HIV.  Talk with your health care provider about whether you are at high risk of being infected with HIV. If you choose to begin PrEP, you should first be tested for HIV. You should then be tested every 3 months for as long as you are taking PrEP.  Osteoporosis is a disease in which the bones lose minerals and strength   with aging. This can result in serious bone fractures or breaks. The risk of osteoporosis can be identified using a bone density scan. Women ages 65 years and over and women at risk for fractures or osteoporosis should discuss screening with their health care providers. Ask your health care provider whether you should take a calcium supplement or vitamin D to reduce the rate of osteoporosis.  Menopause can be associated with physical symptoms and risks. Hormone replacement therapy is available to decrease symptoms and risks. You should talk to your health care provider about whether hormone replacement therapy is right for you.  Use sunscreen. Apply sunscreen liberally and repeatedly throughout the day. You should seek shade when your shadow is shorter than you. Protect yourself by wearing long sleeves, pants, a wide-brimmed hat, and sunglasses year round, whenever you are outdoors.  Once a month, do a whole body skin exam, using a mirror to look at the skin on your back. Tell your health care provider of new moles, moles that have irregular borders, moles that are larger than a pencil eraser, or moles that have changed in shape or color.  Stay current with required vaccines (immunizations).  Influenza vaccine. All adults should be immunized every year.  Tetanus, diphtheria, and acellular pertussis (Td, Tdap) vaccine. Pregnant women should  receive 1 dose of Tdap vaccine during each pregnancy. The dose should be obtained regardless of the length of time since the last dose. Immunization is preferred during the 27th-36th week of gestation. An adult who has not previously received Tdap or who does not know her vaccine status should receive 1 dose of Tdap. This initial dose should be followed by tetanus and diphtheria toxoids (Td) booster doses every 10 years. Adults with an unknown or incomplete history of completing a 3-dose immunization series with Td-containing vaccines should begin or complete a primary immunization series including a Tdap dose. Adults should receive a Td booster every 10 years.  Varicella vaccine. An adult without evidence of immunity to varicella should receive 2 doses or a second dose if she has previously received 1 dose. Pregnant females who do not have evidence of immunity should receive the first dose after pregnancy. This first dose should be obtained before leaving the health care facility. The second dose should be obtained 4-8 weeks after the first dose.  Human papillomavirus (HPV) vaccine. Females aged 13-26 years who have not received the vaccine previously should obtain the 3-dose series. The vaccine is not recommended for use in pregnant females. However, pregnancy testing is not needed before receiving a dose. If a female is found to be pregnant after receiving a dose, no treatment is needed. In that case, the remaining doses should be delayed until after the pregnancy. Immunization is recommended for any person with an immunocompromised condition through the age of 26 years if she did not get any or all doses earlier. During the 3-dose series, the second dose should be obtained 4-8 weeks after the first dose. The third dose should be obtained 24 weeks after the first dose and 16 weeks after the second dose.  Zoster vaccine. One dose is recommended for adults aged 60 years or older unless certain conditions are  present.  Measles, mumps, and rubella (MMR) vaccine. Adults born before 1957 generally are considered immune to measles and mumps. Adults born in 1957 or later should have 1 or more doses of MMR vaccine unless there is a contraindication to the vaccine or there is laboratory evidence of immunity to   each of the three diseases. A routine second dose of MMR vaccine should be obtained at least 28 days after the first dose for students attending postsecondary schools, health care workers, or international travelers. People who received inactivated measles vaccine or an unknown type of measles vaccine during 1963-1967 should receive 2 doses of MMR vaccine. People who received inactivated mumps vaccine or an unknown type of mumps vaccine before 1979 and are at high risk for mumps infection should consider immunization with 2 doses of MMR vaccine. For females of childbearing age, rubella immunity should be determined. If there is no evidence of immunity, females who are not pregnant should be vaccinated. If there is no evidence of immunity, females who are pregnant should delay immunization until after pregnancy. Unvaccinated health care workers born before 1957 who lack laboratory evidence of measles, mumps, or rubella immunity or laboratory confirmation of disease should consider measles and mumps immunization with 2 doses of MMR vaccine or rubella immunization with 1 dose of MMR vaccine.  Pneumococcal 13-valent conjugate (PCV13) vaccine. When indicated, a person who is uncertain of her immunization history and has no record of immunization should receive the PCV13 vaccine. An adult aged 19 years or older who has certain medical conditions and has not been previously immunized should receive 1 dose of PCV13 vaccine. This PCV13 should be followed with a dose of pneumococcal polysaccharide (PPSV23) vaccine. The PPSV23 vaccine dose should be obtained at least 8 weeks after the dose of PCV13 vaccine. An adult aged 19  years or older who has certain medical conditions and previously received 1 or more doses of PPSV23 vaccine should receive 1 dose of PCV13. The PCV13 vaccine dose should be obtained 1 or more years after the last PPSV23 vaccine dose.  Pneumococcal polysaccharide (PPSV23) vaccine. When PCV13 is also indicated, PCV13 should be obtained first. All adults aged 65 years and older should be immunized. An adult younger than age 65 years who has certain medical conditions should be immunized. Any person who resides in a nursing home or long-term care facility should be immunized. An adult smoker should be immunized. People with an immunocompromised condition and certain other conditions should receive both PCV13 and PPSV23 vaccines. People with human immunodeficiency virus (HIV) infection should be immunized as soon as possible after diagnosis. Immunization during chemotherapy or radiation therapy should be avoided. Routine use of PPSV23 vaccine is not recommended for American Indians, [place] Natives, or people younger than 65 years unless there are medical conditions that require PPSV23 vaccine. When indicated, people who have unknown immunization and have no record of immunization should receive PPSV23 vaccine. One-time revaccination 5 years after the first dose of PPSV23 is recommended for people aged 19-64 years who have chronic kidney failure, nephrotic syndrome, asplenia, or immunocompromised conditions. People who received 1-2 doses of PPSV23 before age 65 years should receive another dose of PPSV23 vaccine at age 65 years or later if at least 5 years have passed since the previous dose. Doses of PPSV23 are not needed for people immunized with PPSV23 at or after age 65 years.  Meningococcal vaccine. Adults with asplenia or persistent complement component deficiencies should receive 2 doses of quadrivalent meningococcal conjugate (MenACWY-D) vaccine. The doses should be obtained at least 2 months apart.  Microbiologists working with certain meningococcal bacteria, military recruits, people at risk during an outbreak, and people who travel to or live in countries with a high rate of meningitis should be immunized. A first-year college student up through age   21 years who is living in a residence hall should receive a dose if she did not receive a dose on or after her 16th birthday. Adults who have certain high-risk conditions should receive one or more doses of vaccine.  Hepatitis A vaccine. Adults who wish to be protected from this disease, have certain high-risk conditions, work with hepatitis A-infected animals, work in hepatitis A research labs, or travel to or work in countries with a high rate of hepatitis A should be immunized. Adults who were previously unvaccinated and who anticipate close contact with an international adoptee during the first 60 days after arrival in the [place] from a country with a high rate of hepatitis A should be immunized.  Hepatitis B vaccine. Adults who wish to be protected from this disease, have certain high-risk conditions, may be exposed to blood or other infectious body fluids, are household contacts or sex partners of hepatitis B positive people, are clients or workers in certain care facilities, or travel to or work in countries with a high rate of hepatitis B should be immunized.  Haemophilus influenzae type b (Hib) vaccine. A previously unvaccinated person with asplenia or sickle cell disease or having a scheduled splenectomy should receive 1 dose of Hib vaccine. Regardless of previous immunization, a recipient of a hematopoietic stem cell transplant should receive a 3-dose series 6-12 months after her successful transplant. Hib vaccine is not recommended for adults with HIV infection. Preventive Services / Frequency Ages 64 to 68 years  Blood pressure check.** / Every 1 to 2 years.  Lipid and cholesterol check.** / Every 5 years beginning at age  22.  Clinical breast exam.** / Every 3 years for women in their 88s and 53s.  BRCA-related cancer risk assessment.** / For women who have family members with a BRCA-related cancer (breast, ovarian, tubal, or peritoneal cancers).  Pap test.** / Every 2 years from ages 90 through 51. Every 3 years starting at age 21 through age 56 or 3 with a history of 3 consecutive normal Pap tests.  HPV screening.** / Every 3 years from ages 24 through ages 1 to 46 with a history of 3 consecutive normal Pap tests.  Hepatitis C blood test.** / For any individual with known risks for hepatitis C.  Skin self-exam. / Monthly.  Influenza vaccine. / Every year.  Tetanus, diphtheria, and acellular pertussis (Tdap, Td) vaccine.** / Consult your health care provider. Pregnant women should receive 1 dose of Tdap vaccine during each pregnancy. 1 dose of Td every 10 years.  Varicella vaccine.** / Consult your health care provider. Pregnant females who do not have evidence of immunity should receive the first dose after pregnancy.  HPV vaccine. / 3 doses over 6 months, if 72 and younger. The vaccine is not recommended for use in pregnant females. However, pregnancy testing is not needed before receiving a dose.  Measles, mumps, rubella (MMR) vaccine.** / You need at least 1 dose of MMR if you were born in 1957 or later. You may also need a 2nd dose. For females of childbearing age, rubella immunity should be determined. If there is no evidence of immunity, females who are not pregnant should be vaccinated. If there is no evidence of immunity, females who are pregnant should delay immunization until after pregnancy.  Pneumococcal 13-valent conjugate (PCV13) vaccine.** / Consult your health care provider.  Pneumococcal polysaccharide (PPSV23) vaccine.** / 1 to 2 doses if you smoke cigarettes or if you have certain conditions.  Meningococcal vaccine.** /  1 dose if you are age 19 to 21 years and a first-year college  student living in a residence hall, or have one of several medical conditions, you need to get vaccinated against meningococcal disease. You may also need additional booster doses.  Hepatitis A vaccine.** / Consult your health care provider.  Hepatitis B vaccine.** / Consult your health care provider.  Haemophilus influenzae type b (Hib) vaccine.** / Consult your health care provider. Ages 40 to 64 years  Blood pressure check.** / Every 1 to 2 years.  Lipid and cholesterol check.** / Every 5 years beginning at age 20 years.  Lung cancer screening. / Every year if you are aged 55-80 years and have a 30-pack-year history of smoking and currently smoke or have quit within the past 15 years. Yearly screening is stopped once you have quit smoking for at least 15 years or develop a health problem that would prevent you from having lung cancer treatment.  Clinical breast exam.** / Every year after age 40 years.  BRCA-related cancer risk assessment.** / For women who have family members with a BRCA-related cancer (breast, ovarian, tubal, or peritoneal cancers).  Mammogram.** / Every year beginning at age 40 years and continuing for as long as you are in good health. Consult with your health care provider.  Pap test.** / Every 3 years starting at age 30 years through age 65 or 70 years with a history of 3 consecutive normal Pap tests.  HPV screening.** / Every 3 years from ages 30 years through ages 65 to 70 years with a history of 3 consecutive normal Pap tests.  Fecal occult blood test (FOBT) of stool. / Every year beginning at age 50 years and continuing until age 75 years. You may not need to do this test if you get a colonoscopy every 10 years.  Flexible sigmoidoscopy or colonoscopy.** / Every 5 years for a flexible sigmoidoscopy or every 10 years for a colonoscopy beginning at age 50 years and continuing until age 75 years.  Hepatitis C blood test.** / For all people born from 1945 through  1965 and any individual with known risks for hepatitis C.  Skin self-exam. / Monthly.  Influenza vaccine. / Every year.  Tetanus, diphtheria, and acellular pertussis (Tdap/Td) vaccine.** / Consult your health care provider. Pregnant women should receive 1 dose of Tdap vaccine during each pregnancy. 1 dose of Td every 10 years.  Varicella vaccine.** / Consult your health care provider. Pregnant females who do not have evidence of immunity should receive the first dose after pregnancy.  Zoster vaccine.** / 1 dose for adults aged 60 years or older.  Measles, mumps, rubella (MMR) vaccine.** / You need at least 1 dose of MMR if you were born in 1957 or later. You may also need a 2nd dose. For females of childbearing age, rubella immunity should be determined. If there is no evidence of immunity, females who are not pregnant should be vaccinated. If there is no evidence of immunity, females who are pregnant should delay immunization until after pregnancy.  Pneumococcal 13-valent conjugate (PCV13) vaccine.** / Consult your health care provider.  Pneumococcal polysaccharide (PPSV23) vaccine.** / 1 to 2 doses if you smoke cigarettes or if you have certain conditions.  Meningococcal vaccine.** / Consult your health care provider.  Hepatitis A vaccine.** / Consult your health care provider.  Hepatitis B vaccine.** / Consult your health care provider.  Haemophilus influenzae type b (Hib) vaccine.** / Consult your health care provider. Ages 65   years and over  Blood pressure check.** / Every 1 to 2 years.  Lipid and cholesterol check.** / Every 5 years beginning at age 22 years.  Lung cancer screening. / Every year if you are aged 73-80 years and have a 30-pack-year history of smoking and currently smoke or have quit within the past 15 years. Yearly screening is stopped once you have quit smoking for at least 15 years or develop a health problem that would prevent you from having lung cancer  treatment.  Clinical breast exam.** / Every year after age 4 years.  BRCA-related cancer risk assessment.** / For women who have family members with a BRCA-related cancer (breast, ovarian, tubal, or peritoneal cancers).  Mammogram.** / Every year beginning at age 40 years and continuing for as long as you are in good health. Consult with your health care provider.  Pap test.** / Every 3 years starting at age 9 years through age 34 or 91 years with 3 consecutive normal Pap tests. Testing can be stopped between 65 and 70 years with 3 consecutive normal Pap tests and no abnormal Pap or HPV tests in the past 10 years.  HPV screening.** / Every 3 years from ages 57 years through ages 64 or 45 years with a history of 3 consecutive normal Pap tests. Testing can be stopped between 65 and 70 years with 3 consecutive normal Pap tests and no abnormal Pap or HPV tests in the past 10 years.  Fecal occult blood test (FOBT) of stool. / Every year beginning at age 15 years and continuing until age 17 years. You may not need to do this test if you get a colonoscopy every 10 years.  Flexible sigmoidoscopy or colonoscopy.** / Every 5 years for a flexible sigmoidoscopy or every 10 years for a colonoscopy beginning at age 86 years and continuing until age 71 years.  Hepatitis C blood test.** / For all people born from [birth year] through 1965 and any individual with known risks for hepatitis C.  Osteoporosis screening.** / A one-time screening for women ages 83 years and over and women at risk for fractures or osteoporosis.  Skin self-exam. / Monthly.  Influenza vaccine. / Every year.  Tetanus, diphtheria, and acellular pertussis (Tdap/Td) vaccine.** / 1 dose of Td every 10 years.  Varicella vaccine.** / Consult your health care provider.  Zoster vaccine.** / 1 dose for adults aged 61 years or older.  Pneumococcal 13-valent conjugate (PCV13) vaccine.** / Consult your health care provider.  Pneumococcal  polysaccharide (PPSV23) vaccine.** / 1 dose for all adults aged 28 years and older.  Meningococcal vaccine.** / Consult your health care provider.  Hepatitis A vaccine.** / Consult your health care provider.  Hepatitis B vaccine.** / Consult your health care provider.  Haemophilus influenzae type b (Hib) vaccine.** / Consult your health care provider. ** Family history and personal history of risk and conditions may change your health care provider's recommendations. Document Released: 10/08/2001 Document Revised: 12/27/2013 Document Reviewed: 01/07/2011 [facility] Patient Information 2015 [place], [place]. This information is not intended to replace advice given to you by your health care provider. Make sure you discuss any questions you have with your health care provider.

## 2016-02-29 ENCOUNTER — Encounter: Payer: Self-pay | Admitting: *Deleted

## 2017-07-08 ENCOUNTER — Ambulatory Visit (INDEPENDENT_AMBULATORY_CARE_PROVIDER_SITE_OTHER): Payer: 59 | Admitting: Obstetrics & Gynecology

## 2017-07-08 ENCOUNTER — Encounter (INDEPENDENT_AMBULATORY_CARE_PROVIDER_SITE_OTHER): Payer: Self-pay

## 2017-07-08 ENCOUNTER — Encounter: Payer: Self-pay | Admitting: Obstetrics & Gynecology

## 2017-07-08 VITALS — BP 111/74 | HR 78 | Wt 149.6 lb

## 2017-07-08 DIAGNOSIS — Z01419 Encounter for gynecological examination (general) (routine) without abnormal findings: Secondary | ICD-10-CM

## 2017-07-08 DIAGNOSIS — N644 Mastodynia: Secondary | ICD-10-CM

## 2017-07-08 DIAGNOSIS — Z1151 Encounter for screening for human papillomavirus (HPV): Secondary | ICD-10-CM

## 2017-07-08 DIAGNOSIS — Z124 Encounter for screening for malignant neoplasm of cervix: Secondary | ICD-10-CM

## 2017-07-08 NOTE — Patient Instructions (Signed)
Preventive Care 18-39 Years, Female Preventive care refers to lifestyle choices and visits with your health care provider that can promote health and wellness. What does preventive care include?  A yearly physical exam. This is also called an annual well check.  Dental exams once or twice a year.  Routine eye exams. Ask your health care provider how often you should have your eyes checked.  Personal lifestyle choices, including: ? Daily care of your teeth and gums. ? Regular physical activity. ? Eating a healthy diet. ? Avoiding tobacco and drug use. ? Limiting alcohol use. ? Practicing safe sex. ? Taking vitamin and mineral supplements as recommended by your health care provider. What happens during an annual well check? The services and screenings done by your health care provider during your annual well check will depend on your age, overall health, lifestyle risk factors, and family history of disease. Counseling Your health care provider may ask you questions about your:  Alcohol use.  Tobacco use.  Drug use.  Emotional well-being.  Home and relationship well-being.  Sexual activity.  Eating habits.  Work and work Statistician.  Method of birth control.  Menstrual cycle.  Pregnancy history.  Screening You may have the following tests or measurements:  Height, weight, and BMI.  Diabetes screening. This is done by checking your blood sugar (glucose) after you have not eaten for a while (fasting).  Blood pressure.  Lipid and cholesterol levels. These may be checked every 5 years starting at age 66.  Skin check.  Hepatitis C blood test.  Hepatitis B blood test.  Sexually transmitted disease (STD) testing.  BRCA-related cancer screening. This may be done if you have a family history of breast, ovarian, tubal, or peritoneal cancers.  Pelvic exam and Pap test. This may be done every 3 years starting at age 40. Starting at age 59, this may be done every 5  years if you have a Pap test in combination with an HPV test.  Discuss your test results, treatment options, and if necessary, the need for more tests with your health care provider. Vaccines Your health care provider may recommend certain vaccines, such as:  Influenza vaccine. This is recommended every year.  Tetanus, diphtheria, and acellular pertussis (Tdap, Td) vaccine. You may need a Td booster every 10 years.  Varicella vaccine. You may need this if you have not been vaccinated.  HPV vaccine. If you are 69 or younger, you may need three doses over 6 months.  Measles, mumps, and rubella (MMR) vaccine. You may need at least one dose of MMR. You may also need a second dose.  Pneumococcal 13-valent conjugate (PCV13) vaccine. You may need this if you have certain conditions and were not previously vaccinated.  Pneumococcal polysaccharide (PPSV23) vaccine. You may need one or two doses if you smoke cigarettes or if you have certain conditions.  Meningococcal vaccine. One dose is recommended if you are age 27-21 years and a first-year college student living in a residence hall, or if you have one of several medical conditions. You may also need additional booster doses.  Hepatitis A vaccine. You may need this if you have certain conditions or if you travel or work in places where you may be exposed to hepatitis A.  Hepatitis B vaccine. You may need this if you have certain conditions or if you travel or work in places where you may be exposed to hepatitis B.  Haemophilus influenzae type b (Hib) vaccine. You may need this if  you have certain risk factors.  Talk to your health care provider about which screenings and vaccines you need and how often you need them. This information is not intended to replace advice given to you by your health care provider. Make sure you discuss any questions you have with your health care provider. Document Released: 10/08/2001 Document Revised: 05/01/2016  Document Reviewed: 06/13/2015 Elsevier Interactive Patient Education  2017 [employer].

## 2017-07-09 NOTE — Progress Notes (Signed)
GYNECOLOGY ANNUAL PREVENTATIVE CARE ENCOUNTER NOTE  Subjective:   [name] is a 31 y.o. [phone] female here for a routine annual gynecologic exam.  Current complaints: no right breast pain occasionally.   Denies abnormal vaginal bleeding, discharge, pelvic pain, problems with intercourse or other gynecologic concerns.    Gynecologic History No LMP recorded. Contraception: none Last Pap: 02/19/2013. Results were: normal  Obstetric History OB History  Gravida Para Term Preterm AB Living  4 2 2  0 2 2  SAB TAB Ectopic Multiple Live Births  2 0 0 0 2    # Outcome Date GA Lbr Len/2nd Weight Sex Delivery Anes PTL Lv  4 Term 12/24/14 [id number] 08:05 / 00:40 8 lb 8.2 oz (3.86 kg) M Vag-Spont EPI  LIV  3 Term 09/27/13 6669w0d 23:00 / 05:38 7 lb 11.8 oz (3.51 kg) M Vag-Spont EPI  LIV  2 SAB           1 SAB               Past Medical History:  Diagnosis Date  . Heart murmur     Past Surgical History:  Procedure Laterality Date  . MOLE REMOVAL     on back  . WISDOM TOOTH EXTRACTION     x 2    Current Outpatient Medications on File Prior to Visit  Medication Sig Dispense Refill  . folic acid (FOLVITE) 400 MCG tablet Take 400 mcg by mouth daily.    . Multiple Vitamin (MULTIVITAMIN WITH MINERALS) TABS Take 3 tablets by mouth daily. Takes 3 gummies "Alive" daily.  This is the recommended dose.     No current facility-administered medications on file prior to visit.     No Known Allergies  Social History   Socioeconomic History  . Marital status: Married    Spouse name: Not on file  . Number of children: Not on file  . Years of education: Not on file  . Highest education level: Not on file  Social Needs  . Financial resource strain: Not on file  . Food insecurity - worry: Not on file  . Food insecurity - inability: Not on file  . Transportation needs - medical: Not on file  . Transportation needs - non-medical: Not on file  Occupational History  . Not on  file  Tobacco Use  . Smoking status: Never Smoker  . Smokeless tobacco: Never Used  Substance and Sexual Activity  . Alcohol use: No  . Drug use: No  . Sexual activity: Yes    Partners: Male    Birth control/protection: None  Other Topics Concern  . Not on file  Social History Narrative  . Not on file    Family History  Problem Relation Age of Onset  . Alzheimer's disease Maternal Grandmother   . Osteoporosis Maternal Grandmother   . Hyperlipidemia Maternal Grandmother   . Stroke Paternal Grandfather   . Brain cancer Maternal Grandfather     The following portions of the patient's history were reviewed and updated as appropriate: allergies, current medications, past family history, past medical history, past social history, past surgical history and problem list.  Review of Systems Pertinent items noted in HPI and remainder of comprehensive ROS otherwise negative.   Objective:  BP 111/74   Pulse 78   Wt 149 lb 9.6 oz (67.9 kg)   BMI 24.89 kg/m  CONSTITUTIONAL: Well-developed, well-nourished female in no acute distress.  HENT:  Normocephalic, atraumatic, External right and  left ear normal. Oropharynx is clear and moist EYES: Conjunctivae and EOM are normal. Pupils are equal, round, and reactive to light. No scleral icterus.  NECK: Normal range of motion, supple, no masses.  Normal thyroid.  SKIN: Skin is warm and dry. No rash noted. Not diaphoretic. No erythema. No pallor. NEUROLOGIC: Alert and oriented to person, place, and time. Normal reflexes, muscle tone coordination. No cranial nerve deficit noted. PSYCHIATRIC: Normal mood and affect. Normal behavior. Normal judgment and thought content. CARDIOVASCULAR: Normal heart rate noted, regular rhythm RESPIRATORY: Clear to auscultation bilaterally. Effort and breath sounds normal, no problems with respiration noted. BREASTS: Symmetric in size. No masses, skin changes, nipple drainage, or lymphadenopathy. Mild tenderness in  upper quadrants of right breast.  ABDOMEN: Soft, normal bowel sounds, no distention noted.  No tenderness, rebound or guarding.  PELVIC: Normal appearing external genitalia; normal appearing vaginal mucosa and cervix.  No abnormal discharge noted.  Pap smear obtained.  Normal uterine size, no other palpable masses, no uterine or adnexal tenderness. MUSCULOSKELETAL: Normal range of motion. No tenderness.  No cyanosis, clubbing, or edema.  2+ distal pulses.   Assessment and Plan:  1. Encounter for gynecological examination with Papanicolaou smear of cervix - Cytology - PAP Will follow up results of pap smear and manage accordingly. Routine preventative health maintenance measures emphasized.  2. Breast tenderness Normal examination. Pain likely hormonal. No discharge. Patient reassured. Was offered imaging, but she declined for now. Will reevaluate for any worsening symptoms.   Please refer to After Visit Summary for other counseling recommendations.    Jaynie CollinsUGONNA  Jaykwon Morones, MD, FACOG Attending Obstetrician & Gynecologist, [facility]

## 2017-07-14 LAB — CYTOLOGY - PAP
Diagnosis: NEGATIVE
HPV: NOT DETECTED

## 2017-08-07 DIAGNOSIS — M79671 Pain in right foot: Secondary | ICD-10-CM | POA: Diagnosis not present

## 2017-08-07 DIAGNOSIS — S99921A Unspecified injury of right foot, initial encounter: Secondary | ICD-10-CM | POA: Diagnosis not present

## 2017-08-07 DIAGNOSIS — J069 Acute upper respiratory infection, unspecified: Secondary | ICD-10-CM | POA: Diagnosis not present

## 2017-08-07 DIAGNOSIS — S96911A Strain of unspecified muscle and tendon at ankle and foot level, right foot, initial encounter: Secondary | ICD-10-CM | POA: Diagnosis not present

## 2017-08-08 ENCOUNTER — Emergency Department
Admission: EM | Admit: 2017-08-08 | Discharge: 2017-08-08 | Disposition: A | Discharge status: Home / Self Care | Payer: 59 | Attending: Emergency Medicine | Admitting: Emergency Medicine

## 2017-08-08 ENCOUNTER — Other Ambulatory Visit: Payer: Self-pay

## 2017-08-08 ENCOUNTER — Encounter: Payer: Self-pay | Admitting: Emergency Medicine

## 2017-08-08 DIAGNOSIS — Z79899 Other long term (current) drug therapy: Secondary | ICD-10-CM | POA: Diagnosis not present

## 2017-08-08 DIAGNOSIS — T363X5A Adverse effect of macrolides, initial encounter: Secondary | ICD-10-CM | POA: Insufficient documentation

## 2017-08-08 DIAGNOSIS — R531 Weakness: Secondary | ICD-10-CM | POA: Insufficient documentation

## 2017-08-08 DIAGNOSIS — Y658 Other specified misadventures during surgical and medical care: Secondary | ICD-10-CM | POA: Insufficient documentation

## 2017-08-08 DIAGNOSIS — T887XXA Unspecified adverse effect of drug or medicament, initial encounter: Secondary | ICD-10-CM

## 2017-08-08 DIAGNOSIS — R200 Anesthesia of skin: Secondary | ICD-10-CM | POA: Insufficient documentation

## 2017-08-08 DIAGNOSIS — T450X5A Adverse effect of antiallergic and antiemetic drugs, initial encounter: Secondary | ICD-10-CM | POA: Diagnosis not present

## 2017-08-08 DIAGNOSIS — R5383 Other fatigue: Secondary | ICD-10-CM | POA: Insufficient documentation

## 2017-08-08 DIAGNOSIS — T7840XA Allergy, unspecified, initial encounter: Secondary | ICD-10-CM | POA: Diagnosis present

## 2017-08-08 MED ORDER — LORAZEPAM 0.5 MG PO TABS
0.5000 mg | ORAL_TABLET | Freq: Once | ORAL | Status: AC
  Administered 2017-08-08: 0.5 mg via ORAL
  Filled 2017-08-08: qty 1

## 2017-08-08 MED ORDER — SODIUM CHLORIDE 0.9 % IV BOLUS (SEPSIS)
1000.0000 mL | Freq: Once | INTRAVENOUS | Status: AC
  Administered 2017-08-08: 1000 mL via INTRAVENOUS

## 2017-08-08 NOTE — ED Notes (Signed)
Pt is unable to walk and compaints of numbness up to face now. EDP aware and tx to main ED 19

## 2017-08-08 NOTE — ED Triage Notes (Signed)
[facility] and dxd with URI.  Given Zithromax and Tussionex.  Took prescribed dose at 0030 this AM and "could not keep eyes open".  Here this AM complaining of tingling in hands, feet, and lips.  States her legs "feel like they weigh 500 lbs".  Patient states she feels like she could sleep for a week.  Crying in triage, states she can't walk by herself.  Alert and oriented, color good.  Resp. 16 and unlabored.  Placed in WC.  Husband with patient.

## 2017-08-08 NOTE — ED Notes (Signed)
First Nurse Note:  Patient states she took Tussionex and Zithromax as prescribed at 0030 this AM and now feels tearful, legs weak, and tingling in face.  States she is having an allergic reaction.  Speech clear, color good.  RR-16.

## 2017-08-08 NOTE — Discharge Instructions (Signed)
Stop all the medications that you were prescribed yesterday. Drink lots of fluids and eat healthy. Rest for the rest of the day. Return to the ER for any difficulty breathing, weakness or numbness in your body, changes in vision, or any new symptoms concerning to you.

## 2017-08-08 NOTE — ED Provider Notes (Signed)
[facility] Emergency Department Provider Note  ____________________________________________  Time seen: Approximately 12:36 PM  I have reviewed the triage vital signs and the nursing notes.   HISTORY  Chief Complaint Allergic Reaction   HPI [name] is a 31 y.o. female no significant past medical history who presents for evaluation of concerns for possible allergic reaction. Patient was seen in urgent care yesterday with cough and congestion and she was diagnosed with a URI. She was put on Tussionex and a Z-Pak. She reports that she took 5 ML the Tussionex last night and the first azithromycin which she has taken in the past. This morning when she woke up, she felt extremely fatigued and weak. Throughout the day today she has been having numbness in her body. Initially she felt like the numbness was a sending from her feet up to her face but now it has been intermittent in different areas of her body. No angioedema, no hives, no nausea, no vomiting, no diarrhea. She has no headache or changes in vision.  Past Medical History:  Diagnosis Date  . Heart murmur     There are no active problems to display for this patient.   Past Surgical History:  Procedure Laterality Date  . DILATION AND EVACUATION  07/17/2012   Procedure: DILATATION AND EVACUATION;  Surgeon: Fermin Schwabamer Yalcinkaya, MD;  Location: [facility];  Service: Gynecology;  Laterality: N/A;  chromosome studies  . MOLE REMOVAL     on back  . WISDOM TOOTH EXTRACTION     x 2    Prior to Admission medications   Medication Sig Start Date End Date Taking? Authorizing Provider  folic acid (FOLVITE) 400 MCG tablet Take 400 mcg by mouth daily.    [provider]  Multiple Vitamin (MULTIVITAMIN WITH MINERALS) TABS Take 3 tablets by mouth daily. Takes 3 gummies "Alive" daily.  This is the recommended dose.    [provider]    Allergies Patient has no known  allergies.  Family History  Problem Relation Age of Onset  . Alzheimer's disease Maternal Grandmother   . Osteoporosis Maternal Grandmother   . Hyperlipidemia Maternal Grandmother   . Stroke Paternal Grandfather   . Brain cancer Maternal Grandfather     Social History Social History   Tobacco Use  . Smoking status: Never Smoker  . Smokeless tobacco: Never Used  Substance Use Topics  . Alcohol use: No  . Drug use: No    Review of Systems  Constitutional: Negative for fever. Eyes: Negative for visual changes. ENT: Negative for sore throat. Neck: No neck pain  Cardiovascular: Negative for chest pain. Respiratory: Negative for shortness of breath. Gastrointestinal: Negative for abdominal pain, vomiting or diarrhea. Genitourinary: Negative for dysuria. Musculoskeletal: Negative for back pain. Skin: Negative for rash. Neurological: Negative for headaches, weakness. + whole body numbness. Psych: No SI or HI  ____________________________________________   PHYSICAL EXAM:  VITAL SIGNS: ED Triage Vitals [08/08/17 1023]  Enc Vitals Group     BP 113/73     Pulse Rate 85     Resp 16     Temp 98.4 F (36.9 C)     Temp Source Oral     SpO2 100 %     Weight 147 lb 9.6 oz (67 kg)     Height 5\' 5"  (1.651 m)     Head Circumference      Peak Flow      Pain Score  Pain Loc      Pain Edu?      Excl. in GC?     Constitutional: Alert and oriented. Well appearing and in no apparent distress. HEENT:      Head: Normocephalic and atraumatic.         Eyes: Conjunctivae are normal. Sclera is non-icteric.       Mouth/Throat: Mucous membranes are moist.       Neck: Supple with no signs of meningismus. Cardiovascular: Regular rate and rhythm. No murmurs, gallops, or rubs. 2+ symmetrical distal pulses are present in all extremities. No JVD. Respiratory: Normal respiratory effort. Lungs are clear to auscultation bilaterally. No wheezes, crackles, or rhonchi.  Gastrointestinal:  Soft, non tender, and non distended with positive bowel sounds. No rebound or guarding. Musculoskeletal: Nontender with normal range of motion in all extremities. No edema, cyanosis, or erythema of extremities. Neurologic: Normal speech and language. A & O x3, PERRL, EOMI, no nystagmus, CN II-XII intact, motor testing reveals good tone and bulk throughout. There is no evidence of pronator drift or dysmetria. Muscle strength is 5/5 throughout. Deep tendon reflexes are 2+ throughout with downgoing toes. Sensory examination is intact. Gait is normal. Skin: Skin is warm, dry and intact. No rash noted. Psychiatric: Mood and affect are normal. Speech and behavior are normal.  ____________________________________________   LABS (all labs ordered are listed, but only abnormal results are displayed)  Labs Reviewed - No data to display ____________________________________________  EKG  none ____________________________________________  RADIOLOGY  none  ____________________________________________   PROCEDURES  Procedure(s) performed: None Procedures Critical Care performed:  None ____________________________________________   INITIAL IMPRESSION / ASSESSMENT AND PLAN / ED COURSE   31 y.o. female no significant past medical history who presents for evaluation of concerns for possible allergic reaction with whole body numbness. Patient isneurologically intact, she has 2+ DTRs in all 4 extremities, she has normal sensation, normal cranial nerves, post void bladder scan  is 0. Patient able to ambulate to and from the toilet. Even though the possibility of myasthenia or GBS has been considered as patient endorsed ascending numbness, the fact that she has intact reflexes and strength go against this possibility.  Will give ativan and IVF and reassess.     _________________________ 1:23 PM on 08/08/2017 -----------------------------------------  patient was given half a milligram of by mouth  Ativan and a liter of fluids with full resolution of her symptoms. She remains extremely well-appearing, neurologically intact. Recommended that she stop taking the Z-Pak and Tussionex. Discussed signs and symptoms of allergic reaction or possibly neurological disorder and recommended return to the emergency room if these develop.   As part of my medical decision making, I reviewed the following data within the electronic MEDICAL RECORD NUMBER Nursing notes reviewed and incorporated, Notes from prior ED visits and [place] Controlled Substance Database    Pertinent labs & imaging results that were available during my care of the patient were reviewed by me and considered in my medical decision making (see chart for details).    ____________________________________________   FINAL CLINICAL IMPRESSION(S) / ED DIAGNOSES  Final diagnoses:  Medication side effect      NEW MEDICATIONS STARTED DURING THIS VISIT:  ED Discharge Orders    None       Note:  This document was prepared using Dragon voice recognition software and may include unintentional dictation errors.    Nita SickleVeronese, [place], MD 08/08/17 [phone]

## 2017-08-31 DIAGNOSIS — M25475 Effusion, left foot: Secondary | ICD-10-CM | POA: Diagnosis not present

## 2017-08-31 DIAGNOSIS — S9032XA Contusion of left foot, initial encounter: Secondary | ICD-10-CM | POA: Diagnosis not present

## 2017-08-31 DIAGNOSIS — M7989 Other specified soft tissue disorders: Secondary | ICD-10-CM | POA: Diagnosis not present

## 2017-08-31 DIAGNOSIS — M79672 Pain in left foot: Secondary | ICD-10-CM | POA: Diagnosis not present

## 2017-12-18 DIAGNOSIS — Z0000 Encounter for general adult medical examination without abnormal findings: Secondary | ICD-10-CM | POA: Diagnosis not present

## 2017-12-22 DIAGNOSIS — Z0000 Encounter for general adult medical examination without abnormal findings: Secondary | ICD-10-CM | POA: Diagnosis not present

## 2018-07-28 ENCOUNTER — Other Ambulatory Visit: Payer: Self-pay | Admitting: Family Medicine

## 2018-07-28 DIAGNOSIS — R1011 Right upper quadrant pain: Secondary | ICD-10-CM | POA: Diagnosis not present

## 2018-07-29 ENCOUNTER — Ambulatory Visit
Admission: RE | Admit: 2018-07-29 | Discharge: 2018-07-29 | Disposition: A | Discharge status: Home / Self Care | Payer: 59 | Source: Ambulatory Visit | Attending: Family Medicine | Admitting: Family Medicine

## 2018-07-29 DIAGNOSIS — R1011 Right upper quadrant pain: Secondary | ICD-10-CM | POA: Insufficient documentation

## 2018-08-04 ENCOUNTER — Other Ambulatory Visit: Payer: Self-pay | Admitting: Gastroenterology

## 2018-08-04 DIAGNOSIS — R110 Nausea: Secondary | ICD-10-CM | POA: Diagnosis not present

## 2018-08-04 DIAGNOSIS — R6881 Early satiety: Secondary | ICD-10-CM | POA: Diagnosis not present

## 2018-08-04 DIAGNOSIS — R1011 Right upper quadrant pain: Secondary | ICD-10-CM | POA: Diagnosis not present

## 2018-08-12 ENCOUNTER — Ambulatory Visit
Admission: RE | Admit: 2018-08-12 | Discharge: 2018-08-12 | Disposition: A | Discharge status: Home / Self Care | Payer: 59 | Source: Ambulatory Visit | Attending: Gastroenterology | Admitting: Gastroenterology

## 2018-08-12 DIAGNOSIS — R1011 Right upper quadrant pain: Secondary | ICD-10-CM | POA: Diagnosis not present

## 2018-08-12 DIAGNOSIS — R110 Nausea: Secondary | ICD-10-CM | POA: Insufficient documentation

## 2018-08-12 MED ORDER — TECHNETIUM TC 99M MEBROFENIN IV KIT
5.3380 | PACK | Freq: Once | INTRAVENOUS | Status: AC | PRN
  Administered 2018-08-12: 5.338 via INTRAVENOUS

## 2019-08-08 IMAGING — NM NM HEPATO W/GB/PHARM/[PERSON_NAME]
2 series · 12 of 12 positions shown · non-contrast
Comparison: None.

CLINICAL DATA: Right upper quadrant pain

EXAM:
NUCLEAR MEDICINE HEPATOBILIARY IMAGING WITH GALLBLADDER EF
VIEWS:
Anterior right upper quadrant
RADIOPHARMACEUTICALS:  5.338 mCi Fc-00m mebrofenin IV

[Series 1000: gallbladder ef · 4.80mm/px · 6 of 120 frames shown]
[frame 11/120]
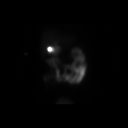
[frame 31/120]
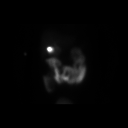
[frame 51/120]
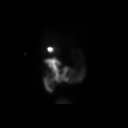
[frame 71/120]
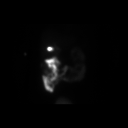
[frame 91/120]
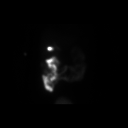
[frame 111/120]
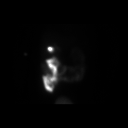

[Series 1000: hepatobiliary scan · 9.59mm/px · 6 of 60 frames shown]
[frame 6/60]
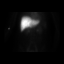
[frame 16/60]
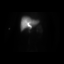
[frame 26/60]
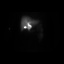
[frame 36/60]
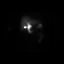
[frame 46/60]
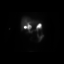
[frame 56/60]
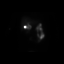

[12 of 12 positions shown; findings below may reference images not displayed]

FINDINGS: Liver uptake of radiotracer is unremarkable. There is prompt
visualization of gallbladder and small bowel, indicating patency of
the cystic and common bile ducts. The patient consumed 8 ounces of
Ensure orally with calculation of the computer generated ejection
fraction of radiotracer from the gallbladder. The patient
experienced abdominal discomfort and nausea with the oral Ensure
consumption. The computer generated ejection fraction of radiotracer
from the gallbladder is normal at 52%, normal greater than 33% using
the oral agent.
IMPRESSION: Normal ejection fraction of radiotracer from the gallbladder. The
patient did experience clinical symptoms with the oral Ensure
consumption. Cystic and common bile ducts are patent as is evidenced
by visualization of gallbladder and small bowel.

## 2022-05-29 ENCOUNTER — Other Ambulatory Visit: Payer: Self-pay | Admitting: Family Medicine

## 2022-05-29 DIAGNOSIS — R1011 Right upper quadrant pain: Secondary | ICD-10-CM

## 2022-06-06 ENCOUNTER — Ambulatory Visit
Admission: RE | Admit: 2022-06-06 | Discharge: 2022-06-06 | Disposition: A | Discharge status: Home / Self Care | Payer: 59 | Source: Ambulatory Visit | Attending: Family Medicine | Admitting: Family Medicine

## 2022-06-06 DIAGNOSIS — R1011 Right upper quadrant pain: Secondary | ICD-10-CM

## 2023-02-13 ENCOUNTER — Other Ambulatory Visit (HOSPITAL_COMMUNITY)
Admission: RE | Admit: 2023-02-13 | Discharge: 2023-02-13 | Disposition: A | Discharge status: Home / Self Care | Payer: BC Managed Care – PPO | Source: Ambulatory Visit | Attending: Obstetrics & Gynecology | Admitting: Obstetrics & Gynecology

## 2023-02-13 ENCOUNTER — Encounter: Payer: Self-pay | Admitting: Obstetrics & Gynecology

## 2023-02-13 ENCOUNTER — Ambulatory Visit: Payer: BC Managed Care – PPO | Admitting: Obstetrics & Gynecology

## 2023-02-13 VITALS — BP 116/79 | HR 101 | Ht 65.0 in | Wt 165.0 lb

## 2023-02-13 DIAGNOSIS — R635 Abnormal weight gain: Secondary | ICD-10-CM

## 2023-02-13 DIAGNOSIS — N8110 Cystocele, unspecified: Secondary | ICD-10-CM

## 2023-02-13 DIAGNOSIS — L680 Hirsutism: Secondary | ICD-10-CM

## 2023-02-13 DIAGNOSIS — Z01419 Encounter for gynecological examination (general) (routine) without abnormal findings: Secondary | ICD-10-CM

## 2023-02-13 DIAGNOSIS — Z1339 Encounter for screening examination for other mental health and behavioral disorders: Secondary | ICD-10-CM

## 2023-02-13 DIAGNOSIS — R6882 Decreased libido: Secondary | ICD-10-CM

## 2023-02-13 DIAGNOSIS — R32 Unspecified urinary incontinence: Secondary | ICD-10-CM

## 2023-02-13 NOTE — Patient Instructions (Addendum)
Look into Addyi, see if you want to try this for low libido  Also research Spironolactone for abnormal hair growth

## 2023-02-13 NOTE — Progress Notes (Signed)
GYNECOLOGY ANNUAL PREVENTATIVE CARE ENCOUNTER NOTE  History:     [name] is a 37 y.o. [phone] female here for a routine annual gynecologic exam.  Has not been seen in several years. Current complaints: leaking of urine with sneezing, standing and yelling since her last delivery in 2016; patient had two SVDs in 2016 and 2016. Also notes vaginal pressure sometimes.  Had noted weight gain, decreased sex drive, increased hair growth in face (chin, mustache) and hair loss and thinning on her head.   Denies abnormal vaginal bleeding, discharge, pelvic pain, problems with intercourse or other gynecologic concerns.    Gynecologic History Patient's last menstrual period was 02/01/2023. Contraception: none Last Pap: 2018. Result was normal with negative HPV  Obstetric History OB History  Gravida Para Term Preterm AB Living  4 2 2  0 2 2  SAB IAB Ectopic Multiple Live Births  2 0 0 0 2    # Outcome Date GA Lbr Len/2nd Weight Sex Delivery Anes PTL Lv  4 Term 12/24/14 [redacted]w[redacted]d 08:05 / 00:40 8 lb 8.2 oz (3.86 kg) M Vag-Spont EPI  LIV  3 Term 09/27/13 [redacted]w[redacted]d 23:00 / 05:38 7 lb 11.8 oz (3.51 kg) M Vag-Spont EPI  LIV  2 SAB           1 SAB             Past Medical History:  Diagnosis Date   Heart murmur     Past Surgical History:  Procedure Laterality Date   DILATION AND EVACUATION  07/17/2012   Procedure: DILATATION AND EVACUATION;  Surgeon: [name], MD;  Location: [facility];  Service: Gynecology;  Laterality: N/A;  chromosome studies   MOLE REMOVAL     on back   WISDOM TOOTH EXTRACTION     x 2    Current Outpatient Medications on File Prior to Visit  Medication Sig Dispense Refill   folic acid (FOLVITE) 400 MCG tablet Take 400 mcg by mouth daily. (Patient not taking: Reported on 02/13/2023)     Multiple Vitamin (MULTIVITAMIN WITH MINERALS) TABS Take 3 tablets by mouth daily. Takes 3 gummies "Alive" daily.  This is the recommended dose. (Patient not taking: Reported  on 02/13/2023)     No current facility-administered medications on file prior to visit.    Allergies  Allergen Reactions   Hydrocodone Other (See Comments)    Caused numbness & could not speak    Social History:  reports that she has never smoked. She has never used smokeless tobacco. She reports that she does not drink alcohol and does not use drugs.  Family History  Problem Relation Age of Onset   Alzheimer's disease Maternal Grandmother    Osteoporosis Maternal Grandmother    Hyperlipidemia Maternal Grandmother    Brain cancer Maternal Grandfather    Cancer - Lung Maternal Grandfather    Stroke Paternal Grandfather    Heart attack Paternal Grandfather     The following portions of the patient's history were reviewed and updated as appropriate: allergies, current medications, past family history, past medical history, past social history, past surgical history and problem list.  Review of Systems Pertinent items noted in HPI and remainder of comprehensive ROS otherwise negative.  Physical Exam:  BP 116/79   Pulse (!) 101   Ht 5\' 5"  (1.651 m)   Wt 165 lb (74.8 kg)   LMP 02/01/2023   BMI 27.46 kg/m  CONSTITUTIONAL: Well-developed, well-nourished female in no acute distress.  HENT:  Normocephalic, atraumatic, External right and left ear normal.  EYES: Conjunctivae and EOM are normal. Pupils are equal, round, and reactive to light. No scleral icterus.  NECK: Normal range of motion, supple, no masses.  Normal thyroid.  SKIN: Skin is warm and dry. No rash noted. Not diaphoretic. No erythema. No pallor. Increased hair note don upper lip area. MUSCULOSKELETAL: Normal range of motion. No tenderness.  No cyanosis, clubbing, or edema. NEUROLOGIC: Alert and oriented to person, place, and time. Normal reflexes, muscle tone coordination.  PSYCHIATRIC: Normal mood and affect. Normal behavior. Normal judgment and thought content. CARDIOVASCULAR: Normal heart rate noted, regular  rhythm RESPIRATORY: Clear to auscultation bilaterally. Effort and breath sounds normal, no problems with respiration noted. BREASTS: Symmetric in size. No masses, tenderness, skin changes, nipple drainage, or lymphadenopathy bilaterally. Performed in the presence of a chaperone. ABDOMEN: Soft, no distention noted.  No tenderness, rebound or guarding.  PELVIC: Normal appearing external genitalia and urethral meatus; normal appearing vaginal mucosa and cervix.  No abnormal vaginal discharge noted.  Pap smear obtained.  Normal uterine size, no other palpable masses, no uterine or adnexal tenderness.  Performed in the presence of a chaperone.   Assessment and Plan:     1. Weight gain Not eating much, and normal level of activity. Will check TSH, will follow up results and manage accordingly. Diet and exercise modifications encouraged. - TSH Rfx on Abnormal to Free T4  2. Hirsutism Will check labs. Consider Spironolactone vs OCPs (patient wants to avoid OCPs if possible) - Testosterone,Free and Total - Estradiol  3. Low libido Discussed complexity of female libido. Encourage sexual aids, counselors etc. Can also try Addyi (not on her insurance formulary) or other remedies.  Will continue to monitor.   4. Baden-Walker grade 1 cystocele 5. Urinary incontinence in female Referred to Urogynecology for further evaluation ad management.  - Ambulatory referral to Urogynecology  6. Well woman exam with routine gynecological exam - Cytology - PAP Will follow up results of pap smear and manage accordingly. Routine preventative health maintenance measures emphasized. Please refer to After Visit Summary for other counseling recommendations.      [name], MD, FACOG Obstetrician & Gynecologist, [facility] [employer], [facility]

## 2023-02-17 LAB — TSH RFX ON ABNORMAL TO FREE T4: TSH: 2 u[IU]/mL (ref 0.450–4.500)

## 2023-02-17 LAB — ESTRADIOL: Estradiol: 91.1 pg/mL

## 2023-02-17 LAB — TESTOSTERONE,FREE AND TOTAL
Testosterone, Free: 0.6 pg/mL (ref 0.0–4.2)
Testosterone: 18 ng/dL (ref 8–60)

## 2023-02-18 ENCOUNTER — Encounter: Payer: Self-pay | Admitting: Obstetrics & Gynecology

## 2023-02-18 DIAGNOSIS — R87619 Unspecified abnormal cytological findings in specimens from cervix uteri: Secondary | ICD-10-CM | POA: Insufficient documentation

## 2023-02-18 LAB — CYTOLOGY - PAP
Comment: NEGATIVE
High risk HPV: NEGATIVE

## 2023-02-18 NOTE — Progress Notes (Signed)
Please schedule patient for colposcopy, endometrial and endocervical sampling for abnormal pap done on 02/13/23 that showed atypical endocervical cells. Please call to inform patient of results and need for appointment.   [name], MD

## 2023-02-21 ENCOUNTER — Telehealth: Payer: Self-pay | Admitting: *Deleted

## 2023-02-21 NOTE — Telephone Encounter (Signed)
Pt informed of results and appt made with Dr A for colpo and EMB on 7/25, advised pt can take 800mg  Ibuprofen and hour or two before appt.

## 2023-02-21 NOTE — Telephone Encounter (Signed)
-----   Message from Diane Newcomer, MD sent at 02/18/2023  4:54 PM EDT ----- Please schedule patient for colposcopy, endometrial and endocervical sampling for abnormal pap done on 02/13/23 that showed atypical endocervical cells. Please call to inform patient of results and need for appointment.   [name], MD

## 2023-03-20 ENCOUNTER — Encounter: Payer: Self-pay | Admitting: Obstetrics & Gynecology

## 2023-03-20 ENCOUNTER — Other Ambulatory Visit (HOSPITAL_COMMUNITY)
Admission: RE | Admit: 2023-03-20 | Discharge: 2023-03-20 | Disposition: A | Discharge status: Home / Self Care | Payer: BC Managed Care – PPO | Source: Ambulatory Visit | Attending: Obstetrics & Gynecology | Admitting: Obstetrics & Gynecology

## 2023-03-20 ENCOUNTER — Ambulatory Visit (INDEPENDENT_AMBULATORY_CARE_PROVIDER_SITE_OTHER): Payer: BC Managed Care – PPO | Admitting: Obstetrics & Gynecology

## 2023-03-20 VITALS — BP 115/79 | HR 88 | Wt 165.0 lb

## 2023-03-20 DIAGNOSIS — R87619 Unspecified abnormal cytological findings in specimens from cervix uteri: Secondary | ICD-10-CM | POA: Diagnosis present

## 2023-03-20 DIAGNOSIS — R87618 Other abnormal cytological findings on specimens from cervix uteri: Secondary | ICD-10-CM

## 2023-03-20 DIAGNOSIS — Z3202 Encounter for pregnancy test, result negative: Secondary | ICD-10-CM

## 2023-03-20 LAB — POCT URINE PREGNANCY: Preg Test, Ur: NEGATIVE

## 2023-03-20 NOTE — Patient Instructions (Signed)
COLPOSCOPY POST-PROCEDURE INSTRUCTIONS  You may take Ibuprofen, Aleve or Tylenol for cramping if needed.  If Monsel's solution was used, you will have a black discharge.  Light bleeding is normal.  If bleeding is heavier than your period, please call.  Put nothing in your vagina until the bleeding or discharge stops (usually 2 or 3 days).  We will call you within one week with biopsy results.   ENDOMETRIAL BIOPSY POST-PROCEDURE INSTRUCTIONS  You may take Ibuprofen, Aleve or Tylenol for pain if needed.  Cramping should resolve within in 24 hours.  You may have a small amount of spotting.  You should wear a mini pad for the next few days.  Put nothing in your vagina until the bleeding or discharge stops (usually 2 or 3 days).  You need to call if you have any severe pelvic pain, fever, heavy bleeding or foul smelling vaginal discharge.  Shower or bathe as normal  6.    We will call you within one week with results.

## 2023-03-20 NOTE — Progress Notes (Signed)
    GYNECOLOGY OFFICE PROCEDURE NOTE  37 y.o. [id number] here for colposcopy, endometrial and endocervical sampling for Atypical endocervical cells on Pap smear on 02/13/23. Negative HRHPV.  Discussed need for evaluation and subsequent surveillance.  Risks of the procedure including cramping, bleeding, infection, uterine perforation, inadequate specimen and need for additional procedures were discussed. Offered alternative of doing these procedures under anesthesia in the OR. The patient states she understands the R/B/I/A and agrees to undergo procedure today. Urine pregnancy test was negative. Consent was signed. Time out was performed. Chaperone was present during entire procedure.  Patient was positioned in dorsal lithotomy position. A vaginal speculum was placed.  Cervix viewed with speculum and colposcope after application of acetic acid.   Colposcopy adequate? Yes Acetowhite lesions noted at 12 and 6 o'clock; corresponding biopsies obtained.   ECC specimen obtained.  The cervix was then prepped with Betadine.  A single-toothed tenaculum was placed on the anterior lip of the cervix to stabilize it. The 3 mm pipelle was easily introduced into the endometrial cavity without difficulty to a depth of 3 cm, and a moderate amount of tissue was obtained after  three passes.   All specimens were labeled and sent to pathology.The instruments were removed from the patient's vagina. Moderate bleeding from the cervix was noted from the ectocervical biopsy sites, Monsel's solution applied. The patient tolerated the procedures well.   Patient was given post procedure instructions.  Will follow up pathology and manage accordingly; patient will be contacted with results and recommendations.  Routine preventative health maintenance measures emphasized.      [name], MD, FACOG Obstetrician & Gynecologist, [facility] for [employer], [facility]

## 2023-04-25 ENCOUNTER — Ambulatory Visit: Payer: BC Managed Care – PPO | Admitting: Obstetrics and Gynecology

## 2023-05-30 ENCOUNTER — Ambulatory Visit: Payer: BC Managed Care – PPO | Admitting: Obstetrics and Gynecology

## 2023-07-04 ENCOUNTER — Encounter: Payer: Self-pay | Admitting: Obstetrics and Gynecology

## 2023-07-04 ENCOUNTER — Ambulatory Visit: Payer: BC Managed Care – PPO | Admitting: Obstetrics and Gynecology

## 2023-07-04 VITALS — BP 120/87 | HR 81 | Ht 65.47 in | Wt 173.4 lb

## 2023-07-04 DIAGNOSIS — N3941 Urge incontinence: Secondary | ICD-10-CM | POA: Diagnosis not present

## 2023-07-04 DIAGNOSIS — N393 Stress incontinence (female) (male): Secondary | ICD-10-CM

## 2023-07-04 NOTE — Progress Notes (Signed)
New Patient Evaluation and Consultation  Referring Provider: Tereso Newcomer, MD PCP: Marisue Ivan, MD Date of Service: 07/04/2023  SUBJECTIVE Chief Complaint: New Patient (Initial Visit) ([name] is a 37 y.o. female here for a consult for urinary incontinence. )  History of Present Illness: [name] is a 37 y.o. White or Caucasian female seen in consultation at the request of Dr Macon Large for evaluation of incontinence.    Review of records significant for: Leaking with sneeze, standing since last delivery in 2016. Also has vaginal pressure.   Urinary Symptoms: Leaks urine with cough/ sneeze, laughing, exercise, lifting, going from sitting to standing, during sex, with a full bladder, with movement to the bathroom, with urgency, and without sensation Started after her children.  SUI >> UUI, can be anywhere from a dribble or sometimes needs to change her clothes.  Leaks 3 time(s) per day.  Pad use: 3 liners/ mini-pads per day.   Patient is bothered by UI symptoms.  Day time voids 5.  Nocturia: 0 times per night to void. Voiding dysfunction:  empties bladder well.  Patient does not use a catheter to empty bladder.  When urinating, patient feels she has no difficulties  UTIs:  0  UTI's in the last year.   Denies history of blood in urine and kidney or bladder stones No results found for the last 90 days.   Pelvic Organ Prolapse Symptoms:                  Patient Denies a feeling of a bulge the vaginal area. Feels swelling with her menstrual cycle.   Bowel Symptom: Bowel movements: 1-2 time(s) per day Stool consistency: hard Straining: no.  Splinting: no.  Incomplete evacuation: no.  Patient Denies accidental bowel leakage / fecal incontinence Bowel regimen: none   Sexual Function Sexually active: yes.  Sexual orientation: Straight Pain with sex: No  Pelvic Pain Denies pelvic pain    Past Medical History:  Past Medical  History:  Diagnosis Date   Heart murmur      Past Surgical History:   Past Surgical History:  Procedure Laterality Date   DILATION AND EVACUATION  07/17/2012   Procedure: DILATATION AND EVACUATION;  Surgeon: [name], MD;  Location: [facility];  Service: Gynecology;  Laterality: N/A;  chromosome studies   MOLE REMOVAL     on back   WISDOM TOOTH EXTRACTION     x 2     Past OB/GYN History: OB History  Gravida Para Term Preterm AB Living  4 2 2  0 2 2  SAB IAB Ectopic Multiple Live Births  2 0 0 0 2    # Outcome Date GA Lbr Len/2nd Weight Sex Type Anes PTL Lv  4 Term 12/24/14 [redacted]w[redacted]d 08:05 / 00:40 8 lb 8.2 oz (3.86 kg) M Vag-Spont EPI  LIV  3 Term 09/27/13 [redacted]w[redacted]d 23:00 / 05:38 7 lb 11.8 oz (3.51 kg) M Vag-Spont EPI  LIV  2 SAB           1 SAB            Patient's last menstrual period was 07/03/2023. Contraception: none.    Component Value Date/Time   DIAGPAP - Atypical endocervical cells, NOS (A) 02/13/2023 0952   DIAGPAP  07/08/2017 0000    NEGATIVE FOR INTRAEPITHELIAL LESIONS OR MALIGNANCY.   HPVHIGH Negative 02/13/2023 0952   ADEQPAP  02/13/2023 0952    Satisfactory for evaluation; transformation zone component PRESENT.  ADEQPAP  07/08/2017 0000    Satisfactory for evaluation  endocervical/transformation zone component PRESENT.   Does not desire any more children  Medications: Patient has a current medication list which includes the following prescription(s): cyanocobalamin and multivitamin.   Allergies: Patient is allergic to hydrocodone.   Social History:  Social History   Tobacco Use   Smoking status: Never   Smokeless tobacco: Never  Vaping Use   Vaping status: Never Used  Substance Use Topics   Alcohol use: No   Drug use: No    Relationship status: married Patient lives with husband and kids.   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 Regular exercise: No History of abuse: No  Family History:   Family History  Problem Relation Age  of Onset   Diabetes Mother    Hypertension Father    COPD Father    Alzheimer's disease Maternal Grandmother    Osteoporosis Maternal Grandmother    Hyperlipidemia Maternal Grandmother    Brain cancer Maternal Grandfather    Cancer - Lung Maternal Grandfather    Stroke Paternal Grandfather    Heart attack Paternal Grandfather      Review of Systems: Review of Systems  Constitutional:  Negative for fever, malaise/fatigue and weight loss.  Respiratory:  Negative for cough, shortness of breath and wheezing.   Cardiovascular:  Positive for leg swelling. Negative for chest pain and palpitations.  Gastrointestinal:  Negative for abdominal pain and blood in stool.  Genitourinary:  Negative for dysuria.       + abnormal periods  Musculoskeletal:  Negative for myalgias.  Skin:  Negative for rash.  Neurological:  Negative for dizziness and headaches.  Endo/Heme/Allergies:  Does not bruise/bleed easily.  Psychiatric/Behavioral:  Negative for depression. The patient is not nervous/anxious.      OBJECTIVE Physical Exam: Vitals:   07/04/23 1055  BP: 120/87  Pulse: 81  Weight: 173 lb 6.4 oz (78.7 kg)  Height: 5' 5.47" (1.663 m)    Physical Exam Constitutional:      General: She is not in acute distress. Pulmonary:     Effort: Pulmonary effort is normal.  Abdominal:     General: There is no distension.     Palpations: Abdomen is soft.     Tenderness: There is no abdominal tenderness. There is no rebound.  Musculoskeletal:        General: No swelling. Normal range of motion.  Skin:    General: Skin is warm and dry.     Findings: No rash.  Neurological:     Mental Status: She is alert and oriented to person, place, and time.  Psychiatric:        Mood and Affect: Mood normal.        Behavior: Behavior normal.      GU / Detailed Urogynecologic Evaluation:  Pelvic Exam: Normal external female genitalia; Bartholin's and Skene's glands normal in appearance; urethral meatus  normal in appearance, no urethral masses or discharge.   CST: negative  Speculum exam reveals normal vaginal mucosa without atrophy. Cervix normal appearance. Blood present in vaginal vault. Uterus normal single, nontender. Adnexa no mass, fullness, tenderness.    Pelvic floor strength I/V Pelvic floor musculature: Right levator non-tender, Right obturator non-tender, Left levator non-tender, Left obturator non-tender  POP-Q:   POP-Q  -3  Aa   -3                                           Ba  -7                                              C   4.5                                            Gh  5                                            Pb  10                                            tvl   -2                                            Ap  -2                                            Bp  -9.5                                              D      Rectal Exam:  Normal external rectum  Post-Void Residual (PVR) by Bladder Scan: In order to evaluate bladder emptying, we discussed obtaining a postvoid residual and patient agreed to this procedure.  Procedure: The ultrasound unit was placed on the patient's abdomen in the suprapubic region after the patient had voided.    Post Void Residual - 07/04/23 1113       Post Void Residual   Post Void Residual 15 mL              Laboratory Results: Lab Results  Component Value Date   BILIRUBINUR NEGATIVE 09/26/2013   PROTEINUR NEGATIVE 09/26/2013   UROBILINOGEN 0.2 09/26/2013   LEUKOCYTESUR NEGATIVE 09/26/2013    No results found for: "CREATININE"  No results found for: "HGBA1C"  Lab Results  Component Value Date   HGB 11.8 (L) 12/24/2014     ASSESSMENT AND PLAN Ms. Burgundy Ewald is a 37 y.o. with:  1. SUI (stress urinary incontinence, female)   2. Urge incontinence    SUI - For treatment of stress urinary incontinence,  non-surgical options include  expectant management, weight loss, physical therapy, as well as a pessary.  Surgical options include a midurethral sling,  and transurethral injection of a bulking agent. - Handouts provided on both options. She  is leaning more toward the sling.  - Will have her return for simple CMG to demonstrate leakage  2. Urge incontinence - rare, will defer treatment   Return for simple CMG   Marguerita Beards, MD

## 2023-07-18 ENCOUNTER — Ambulatory Visit: Payer: BC Managed Care – PPO | Admitting: Obstetrics and Gynecology

## 2023-07-29 ENCOUNTER — Ambulatory Visit: Payer: BC Managed Care – PPO | Admitting: Obstetrics and Gynecology

## 2023-07-30 ENCOUNTER — Ambulatory Visit: Payer: BC Managed Care – PPO | Admitting: Obstetrics and Gynecology

## 2023-08-06 ENCOUNTER — Ambulatory Visit: Payer: BC Managed Care – PPO | Admitting: Obstetrics and Gynecology

## 2023-08-06 ENCOUNTER — Encounter: Payer: Self-pay | Admitting: Obstetrics and Gynecology

## 2023-08-06 VITALS — BP 112/72 | HR 84

## 2023-08-06 DIAGNOSIS — N393 Stress incontinence (female) (male): Secondary | ICD-10-CM | POA: Diagnosis not present

## 2023-08-06 NOTE — Progress Notes (Signed)
[name] Return Visit  SUBJECTIVE  History of Present Illness: [name] is a 37 y.o. female seen in follow-up for SUI. She would like to proceed with a sling. Will perform simple CMG today.     Past Medical History: Patient  has a past medical history of Heart murmur.   Past Surgical History: She  has a past surgical history that includes Mole removal; Wisdom tooth extraction; and Dilation and evacuation (07/17/2012).   Medications: She has a current medication list which includes the following prescription(s): cyanocobalamin and multivitamin.   Allergies: Patient is allergic to hydrocodone.   Social History: Patient  reports that she has never smoked. She has never used smokeless tobacco. She reports that she does not drink alcohol and does not use drugs.      OBJECTIVE     Physical Exam: Vitals:   08/06/23 1038  BP: 112/72  Pulse: 84   Gen: No apparent distress, A&O x 3.  Detailed Urogynecologic Evaluation:  Deferred.    Verbal consent was obtained to perform simple CMG procedure:   Prolapse was reduced using 2 large cotton swabs. Urethra was prepped with betadine and a 17F catheter was placed and bladder was drained completely. The bladder was then backfilled with sterile water by gravity.  First sensation: 170 First Desire: 200 Strong Desire: 220 Capacity: 450 Cough stress test was positive. Valsalva stress test was negative.  Catheter was replaced to empty bladder.   Interpretation: CMG showed normal sensation, and normal cystometric capacity. Findings positive for stress incontinence, negative for detrusor overactivity.     ASSESSMENT AND PLAN    [name] is a 37 y.o. with:  1. SUI (stress urinary incontinence, female)     Plan for surgery: Exam under anesthesia, midurethral sling, cystoscopy  - We reviewed the patient's specific anatomic and functional findings, with the assistance of diagrams, and  together finalized the above procedure. The planned surgical procedures were discussed along with the surgical risks outlined below, which were also provided on a detailed handout. Additional treatment options including expectant management, conservative management, medical management were discussed where appropriate.  We reviewed the benefits and risks of each treatment option.   General Surgical Risks: For all procedures, there are risks of bleeding, infection, damage to surrounding organs including but not limited to bowel, bladder, blood vessels, ureters and nerves, and need for further surgery if an injury were to occur. These risks are all low with minimally invasive surgery.   There are risks of numbness and weakness at any body site or buttock/rectal pain.  It is possible that baseline pain can be worsened by surgery, either with or without mesh. If surgery is vaginal, there is also a low risk of possible conversion to laparoscopy or open abdominal incision where indicated. Very rare risks include blood transfusion, blood clot, heart attack, pneumonia, or death.   There is also a risk of short-term postoperative urinary retention with need to use a catheter. About half of patients need to go home from surgery with a catheter, which is then later removed in the office. The risk of long-term need for a catheter is very low. There is also a risk of worsening of overactive bladder.   Sling: The effectiveness of a midurethral vaginal mesh sling is approximately 85%, and thus, there will be times when you may leak urine after surgery, especially if your bladder is full or if you have a strong cough. There is a balance between  making the sling tight enough to treat your leakage but not too tight so that you have long-term difficulty emptying your bladder. A mesh sling will not directly treat overactive bladder/urge incontinence and may worsen it.  There is an FDA safety notification on vaginal mesh  procedures for prolapse but NOT mesh slings. We have extensive experience and training with mesh placement and we have close postoperative follow up to identify any potential complications from mesh. It is important to realize that this mesh is a permanent implant that cannot be easily removed. There are rare risks of mesh exposure (2-4%), pain with intercourse (0-7%), and infection (<1%). The risk of mesh exposure if more likely in a woman with risks for poor healing (prior radiation, poorly controlled diabetes, or immunocompromised). The risk of new or worsened chronic pain after mesh implant is more common in women with baseline chronic pain and/or poorly controlled anxiety or depression. Approximately 2-4% of patients will experience longer-term post-operative voiding dysfunction that may require surgical revision of the sling. We also reviewed that postoperatively, her stream may not be as strong as before surgery.   - For preop Visit:  She is required to have a visit within 30 days of her surgery.    - Medical clearance: not required  - Anticoagulant use: No - Medicaid Hysterectomy form: No - Accepts blood transfusion: Yes - Expected length of stay: outpatient  Request sent for surgery scheduling.   Diane Beards, MD

## 2023-09-25 ENCOUNTER — Ambulatory Visit: Payer: BC Managed Care – PPO | Admitting: Obstetrics and Gynecology

## 2023-09-25 ENCOUNTER — Encounter: Payer: Self-pay | Admitting: Obstetrics and Gynecology

## 2023-09-25 VITALS — BP 108/74 | HR 87 | Wt 178.2 lb

## 2023-09-25 DIAGNOSIS — N393 Stress incontinence (female) (male): Secondary | ICD-10-CM

## 2023-09-25 DIAGNOSIS — Z01818 Encounter for other preprocedural examination: Secondary | ICD-10-CM

## 2023-09-25 MED ORDER — POLYETHYLENE GLYCOL 3350 17 GM/SCOOP PO POWD: 17.0000 g | Freq: Every day | ORAL | 0 refills | Status: AC

## 2023-09-25 MED ORDER — ONDANSETRON HCL 4 MG PO TABS: 4.0000 mg | ORAL_TABLET | Freq: Three times a day (TID) | ORAL | 0 refills | Status: AC | PRN

## 2023-09-25 MED ORDER — IBUPROFEN 600 MG PO TABS: 600.0000 mg | ORAL_TABLET | Freq: Four times a day (QID) | ORAL | 0 refills | Status: AC | PRN

## 2023-09-25 MED ORDER — ACETAMINOPHEN 500 MG PO TABS: 500.0000 mg | ORAL_TABLET | Freq: Four times a day (QID) | ORAL | 0 refills | Status: AC | PRN

## 2023-09-25 MED ORDER — GABAPENTIN 100 MG PO CAPS: 100.0000 mg | ORAL_CAPSULE | Freq: Three times a day (TID) | ORAL | 0 refills | Status: AC

## 2023-09-25 NOTE — H&P (Signed)
[facility] H&P  Subjective Chief Complaint: [name] presents for a preoperative encounter.   History of Present Illness: [name] is a 38 y.o. female who presents for preoperative visit.  She is scheduled to undergo  Exam under anesthesia, midurethral sling, cystoscopy  on 10/09/23.  Her symptoms include Stress urinary incontinence.   Simple CMG showed: CMG showed normal sensation, and normal cystometric capacity. Findings positive for stress incontinence, negative for detrusor overactivity.   Past Medical History:  Diagnosis Date   Heart murmur      Past Surgical History:  Procedure Laterality Date   DILATION AND EVACUATION  07/17/2012   Procedure: DILATATION AND EVACUATION;  Surgeon: [name], MD;  Location: [facility];  Service: Gynecology;  Laterality: N/A;  chromosome studies   MOLE REMOVAL     on back   WISDOM TOOTH EXTRACTION     x 2    is allergic to hydrocodone.   Family History  Problem Relation Age of Onset   Diabetes Mother    Hypertension Father    COPD Father    Alzheimer's disease Maternal Grandmother    Osteoporosis Maternal Grandmother    Hyperlipidemia Maternal Grandmother    Brain cancer Maternal Grandfather    Cancer - Lung Maternal Grandfather    Stroke Paternal Grandfather    Heart attack Paternal Grandfather     Social History   Tobacco Use   Smoking status: Never   Smokeless tobacco: Never  Vaping Use   Vaping status: Never Used  Substance Use Topics   Alcohol use: No   Drug use: No     Review of Systems was negative for a full 10 system review except as noted in the History of Present Illness.  No current facility-administered medications for this encounter.  Current Outpatient Medications:    acetaminophen (TYLENOL) 500 MG tablet, Take 1 tablet (500 mg total) by mouth every 6 (six) hours as needed (pain)., Disp: 30 tablet, Rfl: 0   cyanocobalamin 1000 MCG tablet, Take 500 mcg  by mouth daily., Disp: , Rfl:    gabapentin (NEURONTIN) 100 MG capsule, Take 1 capsule (100 mg total) by mouth 3 (three) times daily., Disp: 15 capsule, Rfl: 0   ibuprofen (ADVIL) 600 MG tablet, Take 1 tablet (600 mg total) by mouth every 6 (six) hours as needed., Disp: 30 tablet, Rfl: 0   Multiple Vitamin (MULTIVITAMIN) tablet, Take 1 tablet by mouth daily., Disp: , Rfl:    ondansetron (ZOFRAN) 4 MG tablet, Take 1 tablet (4 mg total) by mouth every 8 (eight) hours as needed for nausea or vomiting., Disp: 10 tablet, Rfl: 0   polyethylene glycol powder (GLYCOLAX/MIRALAX) 17 GM/SCOOP powder, Take 17 g by mouth daily. Drink 17g (1 scoop) dissolved in water per day., Disp: 255 g, Rfl: 0   Objective There were no vitals filed for this visit.   Gen: NAD CV: S1 S2 RRR Lungs: Clear to auscultation bilaterally Abd: soft, nontender   Previous Pelvic Exam showed: Normal external female genitalia; Bartholin's and Skene's glands normal in appearance; urethral meatus normal in appearance, no urethral masses or discharge.    CST: negative   Speculum exam reveals normal vaginal mucosa without atrophy. Cervix normal appearance. Blood present in vaginal vault. Uterus normal single, nontender. Adnexa no mass, fullness, tenderness.     Pelvic floor strength I/V Pelvic floor musculature: Right levator non-tender, Right obturator non-tender, Left levator non-tender, Left obturator non-tender   POP-Q:  POP-Q   -3                                            Aa   -3                                           Ba   -7                                              C    4.5                                            Gh   5                                            Pb   10                                            tvl    -2                                            Ap   -2                                            Bp   -9.5                                         Assessment/  Plan  Assessment: The patient is a 38 y.o. year old scheduled to undergo Exam under anesthesia, midurethral sling, cystoscopy. Verbal consent was obtained for these procedures.

## 2023-09-25 NOTE — Progress Notes (Signed)
[facility] Pre-Operative Exam  Subjective Chief Complaint: [name] presents for a preoperative encounter.   History of Present Illness: [name] is a 38 y.o. female who presents for preoperative visit.  She is scheduled to undergo  Exam under anesthesia, midurethral sling, cystoscopy  on 10/09/23.  Her symptoms include Stress urinary incontinence.   Simple CMG showed: CMG showed normal sensation, and normal cystometric capacity. Findings positive for stress incontinence, negative for detrusor overactivity.   Past Medical History:  Diagnosis Date   Heart murmur      Past Surgical History:  Procedure Laterality Date   DILATION AND EVACUATION  07/17/2012   Procedure: DILATATION AND EVACUATION;  Surgeon: [name], MD;  Location: [facility];  Service: Gynecology;  Laterality: N/A;  chromosome studies   MOLE REMOVAL     on back   WISDOM TOOTH EXTRACTION     x 2    is allergic to hydrocodone.   Family History  Problem Relation Age of Onset   Diabetes Mother    Hypertension Father    COPD Father    Alzheimer's disease Maternal Grandmother    Osteoporosis Maternal Grandmother    Hyperlipidemia Maternal Grandmother    Brain cancer Maternal Grandfather    Cancer - Lung Maternal Grandfather    Stroke Paternal Grandfather    Heart attack Paternal Grandfather     Social History   Tobacco Use   Smoking status: Never   Smokeless tobacco: Never  Vaping Use   Vaping status: Never Used  Substance Use Topics   Alcohol use: No   Drug use: No     Review of Systems was negative for a full 10 system review except as noted in the History of Present Illness.   Current Outpatient Medications:    cyanocobalamin 1000 MCG tablet, Take 500 mcg by mouth daily., Disp: , Rfl:    Multiple Vitamin (MULTIVITAMIN) tablet, Take 1 tablet by mouth daily., Disp: , Rfl:    Objective Vitals:   09/25/23 1141  BP: 108/74  Pulse: 87     Gen: NAD CV: S1 S2 RRR Lungs: Clear to auscultation bilaterally Abd: soft, nontender   Previous Pelvic Exam showed: Normal external female genitalia; Bartholin's and Skene's glands normal in appearance; urethral meatus normal in appearance, no urethral masses or discharge.    CST: negative   Speculum exam reveals normal vaginal mucosa without atrophy. Cervix normal appearance. Blood present in vaginal vault. Uterus normal single, nontender. Adnexa no mass, fullness, tenderness.     Pelvic floor strength I/V Pelvic floor musculature: Right levator non-tender, Right obturator non-tender, Left levator non-tender, Left obturator non-tender   POP-Q:    POP-Q   -3                                            Aa   -3                                           Ba   -7  C    4.5                                            Gh   5                                            Pb   10                                            tvl    -2                                            Ap   -2                                            Bp   -9.5                                         Assessment/ Plan  Assessment: The patient is a 38 y.o. year old scheduled to undergo Exam under anesthesia, midurethral sling, cystoscopy. Verbal consent was obtained for these procedures.  Plan: General Surgical Consent: The patient has previously been counseled on alternative treatments, and the decision by the patient and provider was to proceed with the procedure listed above.  For all procedures, there are risks of bleeding, infection, damage to surrounding organs including but not limited to bowel, bladder, blood vessels, ureters and nerves, and need for further surgery if an injury were to occur. These risks are all low with minimally invasive surgery.   There are risks of numbness and weakness at any body site or buttock/rectal pain.  It is possible  that baseline pain can be worsened by surgery, either with or without mesh. If surgery is vaginal, there is also a low risk of possible conversion to laparoscopy or open abdominal incision where indicated. Very rare risks include blood transfusion, blood clot, heart attack, pneumonia, or death.   There is also a risk of short-term postoperative urinary retention with need to use a catheter. About half of patients need to go home from surgery with a catheter, which is then later removed in the office. The risk of long-term need for a catheter is very low. There is also a risk of worsening of overactive bladder.   Sling: The effectiveness of a midurethral vaginal mesh sling is approximately 85%, and thus, there will be times when you may leak urine after surgery, especially if your bladder is full or if you have a strong cough. There is a balance between making the sling tight enough to treat your leakage but not too tight so that you have long-term difficulty emptying your bladder. A mesh sling will not directly treat overactive bladder/urge incontinence and may worsen it.  There is an FDA safety  notification on vaginal mesh procedures for prolapse but NOT mesh slings. We have extensive experience and training with mesh placement and we have close postoperative follow up to identify any potential complications from mesh. It is important to realize that this mesh is a permanent implant that cannot be easily removed. There are rare risks of mesh exposure (2-4%), pain with intercourse (0-7%), and infection (<1%). The risk of mesh exposure if more likely in a woman with risks for poor healing (prior radiation, poorly controlled diabetes, or immunocompromised). The risk of new or worsened chronic pain after mesh implant is more common in women with baseline chronic pain and/or poorly controlled anxiety or depression. Approximately 2-4% of patients will experience longer-term post-operative voiding dysfunction that  may require surgical revision of the sling. We also reviewed that postoperatively, her stream may not be as strong as before surgery.    We discussed consent for blood products. Risks for blood transfusion include allergic reactions, other reactions that can affect different body organs and managed accordingly, transmission of infectious diseases such as HIV or Hepatitis. However, the blood is screened. Patient consents for blood products.  Pre-operative instructions:  She was instructed to not take Aspirin/NSAIDs x 7days prior to surgery. Hold Ginger and Fish oil.  Antibiotic prophylaxis was ordered as indicated.  Catheter use: Patient will go home with foley if needed after post-operative voiding trial.  Post-operative instructions:  She was provided with specific post-operative instructions, including precautions and signs/symptoms for which we would recommend contacting us, in addition to daytime and after-hours contact phone numbers. This was provided on a handout.   Post-operative medications: Prescriptions for motrin, tylenol, miralax, Zofran, and Gabapentin were sent to her pharmacy. Discussed using ibuprofen and tylenol on a schedule to limit use of narcotics.   Laboratory testing:  We will check labs: As requested by anesthesia.   Preoperative clearance:  She does not require surgical clearance.    Post-operative follow-up:  A post-operative appointment will be made for 6 weeks from the date of surgery. If she needs a post-operative nurse visit for a voiding trial, that will be set up after she leaves the hospital.    Patient will call the clinic or use MyChart should anything change or any new issues arise.   Selmer Dominion, NP

## 2023-10-06 ENCOUNTER — Encounter (HOSPITAL_BASED_OUTPATIENT_CLINIC_OR_DEPARTMENT_OTHER): Payer: Self-pay | Admitting: Obstetrics and Gynecology

## 2023-10-06 NOTE — Progress Notes (Signed)
 Spoke w/ via phone for pre-op interview--- pt Lab needs dos---- urine preg        Lab results------ no COVID test -----patient states asymptomatic no test needed Arrive at ------- 1100 on 10-09-2023 NPO after MN NO Solid Food.  Clear liquids from MN until--- 1000 Med rec completed Medications to take morning of surgery ----- none Diabetic medication ----- n/a Patient instructed no nail polish to be worn day of surgery Patient instructed to bring photo id and insurance card day of surgery Patient aware to have Driver (ride ) / caregiver    for 24 hours after surgery - husband, [name] Patient Special Instructions ----- n/a Pre-Op special Instructions ----- n/a Patient verbalized understanding of instructions that were given at this phone interview. Patient denies chest pain, sob, fever, cough at the interview.

## 2023-10-07 ENCOUNTER — Telehealth: Payer: Self-pay | Admitting: Obstetrics and Gynecology

## 2023-10-07 NOTE — Telephone Encounter (Signed)
Patient has decided to push out surgery for now.  Would like to try Pelvic Floor Therapy for a while before proceeding

## 2023-10-08 NOTE — Telephone Encounter (Signed)
Surgery is cancelled. KD

## 2023-10-09 ENCOUNTER — Encounter (HOSPITAL_BASED_OUTPATIENT_CLINIC_OR_DEPARTMENT_OTHER): Admission: RE | Payer: Self-pay | Source: Home / Self Care

## 2023-10-09 ENCOUNTER — Ambulatory Visit (HOSPITAL_BASED_OUTPATIENT_CLINIC_OR_DEPARTMENT_OTHER)
Admission: RE | Admit: 2023-10-09 | Payer: BC Managed Care – PPO | Source: Home / Self Care | Admitting: Obstetrics and Gynecology

## 2023-10-09 DIAGNOSIS — Z01818 Encounter for other preprocedural examination: Secondary | ICD-10-CM

## 2023-10-09 HISTORY — DX: Personal history of other diseases of the respiratory system: Z87.09

## 2023-10-09 HISTORY — DX: Stress incontinence (female) (male): N39.3

## 2023-10-09 HISTORY — DX: Personal history of other diseases of the circulatory system: Z86.79

## 2023-10-09 SURGERY — TRANSVAGINAL TAPE (TVT) PROCEDURE
Anesthesia: General

## 2023-11-20 ENCOUNTER — Encounter: Payer: BC Managed Care – PPO | Admitting: Obstetrics and Gynecology

## 2024-07-05 ENCOUNTER — Ambulatory Visit: Admitting: Obstetrics and Gynecology

## 2024-09-06 ENCOUNTER — Encounter: Payer: Self-pay | Admitting: *Deleted
